# Patient Record
Sex: Female | Born: 1960 | Race: White | Hispanic: No | Marital: Single | Smoking: Current every day smoker
Health system: Southern US, Community
[De-identification: ages and names within clinical notes are randomized; demographics above are authoritative.]

## PROBLEM LIST (undated history)

## (undated) DIAGNOSIS — Z8739 Personal history of other diseases of the musculoskeletal system and connective tissue: Secondary | ICD-10-CM

## (undated) DIAGNOSIS — C801 Malignant (primary) neoplasm, unspecified: Secondary | ICD-10-CM

## (undated) DIAGNOSIS — J45909 Unspecified asthma, uncomplicated: Secondary | ICD-10-CM

## (undated) DIAGNOSIS — F32A Depression, unspecified: Secondary | ICD-10-CM

## (undated) DIAGNOSIS — F329 Major depressive disorder, single episode, unspecified: Secondary | ICD-10-CM

## (undated) DIAGNOSIS — Z720 Tobacco use: Secondary | ICD-10-CM

## (undated) DIAGNOSIS — E119 Type 2 diabetes mellitus without complications: Secondary | ICD-10-CM

## (undated) DIAGNOSIS — J439 Emphysema, unspecified: Secondary | ICD-10-CM

## (undated) DIAGNOSIS — F419 Anxiety disorder, unspecified: Secondary | ICD-10-CM

## (undated) HISTORY — DX: Tobacco use: Z72.0

## (undated) HISTORY — PX: CHOLECYSTECTOMY: SHX55

## (undated) HISTORY — PX: ABDOMINAL HYSTERECTOMY: SHX81

## (undated) SURGERY — Surgical Case
Anesthesia: *Unknown

---
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
## 2017-07-16 DIAGNOSIS — R7989 Other specified abnormal findings of blood chemistry: Secondary | ICD-10-CM | POA: Diagnosis not present

## 2017-07-16 DIAGNOSIS — Z8042 Family history of malignant neoplasm of prostate: Secondary | ICD-10-CM | POA: Diagnosis not present

## 2017-07-16 DIAGNOSIS — Z8052 Family history of malignant neoplasm of bladder: Secondary | ICD-10-CM | POA: Diagnosis not present

## 2017-07-16 DIAGNOSIS — D72829 Elevated white blood cell count, unspecified: Secondary | ICD-10-CM | POA: Diagnosis not present

## 2017-07-16 DIAGNOSIS — Z720 Tobacco use: Secondary | ICD-10-CM | POA: Diagnosis not present

## 2017-09-22 DIAGNOSIS — D72829 Elevated white blood cell count, unspecified: Secondary | ICD-10-CM | POA: Diagnosis not present

## 2017-09-22 DIAGNOSIS — D696 Thrombocytopenia, unspecified: Secondary | ICD-10-CM | POA: Diagnosis not present

## 2017-10-06 DIAGNOSIS — D509 Iron deficiency anemia, unspecified: Secondary | ICD-10-CM | POA: Diagnosis not present

## 2017-10-06 DIAGNOSIS — D72829 Elevated white blood cell count, unspecified: Secondary | ICD-10-CM | POA: Diagnosis not present

## 2017-10-06 DIAGNOSIS — D696 Thrombocytopenia, unspecified: Secondary | ICD-10-CM | POA: Diagnosis not present

## 2018-01-20 DIAGNOSIS — D473 Essential (hemorrhagic) thrombocythemia: Secondary | ICD-10-CM | POA: Diagnosis not present

## 2018-01-20 DIAGNOSIS — D509 Iron deficiency anemia, unspecified: Secondary | ICD-10-CM | POA: Diagnosis not present

## 2018-01-21 DIAGNOSIS — F17210 Nicotine dependence, cigarettes, uncomplicated: Secondary | ICD-10-CM | POA: Diagnosis not present

## 2018-01-21 DIAGNOSIS — R531 Weakness: Secondary | ICD-10-CM | POA: Diagnosis not present

## 2018-01-21 DIAGNOSIS — D72829 Elevated white blood cell count, unspecified: Secondary | ICD-10-CM | POA: Diagnosis not present

## 2018-01-21 DIAGNOSIS — D696 Thrombocytopenia, unspecified: Secondary | ICD-10-CM | POA: Diagnosis not present

## 2018-01-21 DIAGNOSIS — R634 Abnormal weight loss: Secondary | ICD-10-CM | POA: Diagnosis not present

## 2018-01-21 DIAGNOSIS — D509 Iron deficiency anemia, unspecified: Secondary | ICD-10-CM | POA: Diagnosis not present

## 2018-01-22 DIAGNOSIS — F411 Generalized anxiety disorder: Secondary | ICD-10-CM | POA: Diagnosis not present

## 2018-01-22 DIAGNOSIS — F331 Major depressive disorder, recurrent, moderate: Secondary | ICD-10-CM | POA: Diagnosis not present

## 2018-01-25 ENCOUNTER — Emergency Department (HOSPITAL_COMMUNITY): Payer: Medicare Other

## 2018-01-25 ENCOUNTER — Other Ambulatory Visit: Payer: Self-pay

## 2018-01-25 ENCOUNTER — Encounter (HOSPITAL_COMMUNITY): Payer: Self-pay

## 2018-01-25 ENCOUNTER — Inpatient Hospital Stay (HOSPITAL_COMMUNITY)
Admission: EM | Admit: 2018-01-25 | Discharge: 2018-01-28 | DRG: 885 | Disposition: A | Discharge status: Home / Self Care | Payer: Medicare Other | Attending: Internal Medicine | Admitting: Internal Medicine

## 2018-01-25 DIAGNOSIS — R4182 Altered mental status, unspecified: Secondary | ICD-10-CM

## 2018-01-25 DIAGNOSIS — R9431 Abnormal electrocardiogram [ECG] [EKG]: Secondary | ICD-10-CM | POA: Diagnosis present

## 2018-01-25 DIAGNOSIS — R410 Disorientation, unspecified: Secondary | ICD-10-CM | POA: Diagnosis not present

## 2018-01-25 DIAGNOSIS — F23 Brief psychotic disorder: Principal | ICD-10-CM | POA: Diagnosis present

## 2018-01-25 DIAGNOSIS — D72829 Elevated white blood cell count, unspecified: Secondary | ICD-10-CM | POA: Diagnosis present

## 2018-01-25 DIAGNOSIS — R338 Other retention of urine: Secondary | ICD-10-CM | POA: Diagnosis not present

## 2018-01-25 DIAGNOSIS — K219 Gastro-esophageal reflux disease without esophagitis: Secondary | ICD-10-CM | POA: Diagnosis not present

## 2018-01-25 DIAGNOSIS — Z90710 Acquired absence of both cervix and uterus: Secondary | ICD-10-CM

## 2018-01-25 DIAGNOSIS — Z9049 Acquired absence of other specified parts of digestive tract: Secondary | ICD-10-CM

## 2018-01-25 DIAGNOSIS — E876 Hypokalemia: Secondary | ICD-10-CM | POA: Diagnosis not present

## 2018-01-25 DIAGNOSIS — G929 Unspecified toxic encephalopathy: Secondary | ICD-10-CM | POA: Diagnosis present

## 2018-01-25 DIAGNOSIS — G92 Toxic encephalopathy: Secondary | ICD-10-CM | POA: Diagnosis not present

## 2018-01-25 DIAGNOSIS — E785 Hyperlipidemia, unspecified: Secondary | ICD-10-CM | POA: Diagnosis present

## 2018-01-25 DIAGNOSIS — Z720 Tobacco use: Secondary | ICD-10-CM | POA: Diagnosis not present

## 2018-01-25 DIAGNOSIS — E041 Nontoxic single thyroid nodule: Secondary | ICD-10-CM | POA: Diagnosis present

## 2018-01-25 DIAGNOSIS — F419 Anxiety disorder, unspecified: Secondary | ICD-10-CM | POA: Diagnosis present

## 2018-01-25 DIAGNOSIS — G40909 Epilepsy, unspecified, not intractable, without status epilepticus: Secondary | ICD-10-CM | POA: Diagnosis not present

## 2018-01-25 DIAGNOSIS — E860 Dehydration: Secondary | ICD-10-CM | POA: Diagnosis present

## 2018-01-25 DIAGNOSIS — F17210 Nicotine dependence, cigarettes, uncomplicated: Secondary | ICD-10-CM | POA: Diagnosis present

## 2018-01-25 DIAGNOSIS — G9340 Encephalopathy, unspecified: Secondary | ICD-10-CM

## 2018-01-25 DIAGNOSIS — Z781 Physical restraint status: Secondary | ICD-10-CM | POA: Diagnosis not present

## 2018-01-25 DIAGNOSIS — R0989 Other specified symptoms and signs involving the circulatory and respiratory systems: Secondary | ICD-10-CM | POA: Diagnosis not present

## 2018-01-25 DIAGNOSIS — G4700 Insomnia, unspecified: Secondary | ICD-10-CM | POA: Diagnosis present

## 2018-01-25 DIAGNOSIS — F29 Unspecified psychosis not due to a substance or known physiological condition: Secondary | ICD-10-CM | POA: Diagnosis present

## 2018-01-25 DIAGNOSIS — F329 Major depressive disorder, single episode, unspecified: Secondary | ICD-10-CM | POA: Diagnosis not present

## 2018-01-25 DIAGNOSIS — R339 Retention of urine, unspecified: Secondary | ICD-10-CM | POA: Diagnosis present

## 2018-01-25 DIAGNOSIS — Z79899 Other long term (current) drug therapy: Secondary | ICD-10-CM

## 2018-01-25 DIAGNOSIS — S0990XA Unspecified injury of head, initial encounter: Secondary | ICD-10-CM | POA: Diagnosis not present

## 2018-01-25 DIAGNOSIS — G8929 Other chronic pain: Secondary | ICD-10-CM | POA: Diagnosis present

## 2018-01-25 DIAGNOSIS — R748 Abnormal levels of other serum enzymes: Secondary | ICD-10-CM | POA: Diagnosis present

## 2018-01-25 DIAGNOSIS — S199XXA Unspecified injury of neck, initial encounter: Secondary | ICD-10-CM | POA: Diagnosis not present

## 2018-01-25 HISTORY — DX: Personal history of other diseases of the musculoskeletal system and connective tissue: Z87.39

## 2018-01-25 LAB — RAPID URINE DRUG SCREEN, HOSP PERFORMED
Amphetamines: NOT DETECTED
BARBITURATES: NOT DETECTED
BENZODIAZEPINES: NOT DETECTED
COCAINE: NOT DETECTED
OPIATES: NOT DETECTED
Tetrahydrocannabinol: POSITIVE — AB

## 2018-01-25 LAB — CBC WITH DIFFERENTIAL/PLATELET
BASOS ABS: 0 10*3/uL (ref 0.0–0.1)
Basophils Relative: 0 %
EOS ABS: 0.2 10*3/uL (ref 0.0–0.7)
Eosinophils Relative: 1 %
HCT: 50.8 % — ABNORMAL HIGH (ref 36.0–46.0)
Hemoglobin: 17.7 g/dL — ABNORMAL HIGH (ref 12.0–15.0)
LYMPHS ABS: 6.8 10*3/uL — AB (ref 0.7–4.0)
Lymphocytes Relative: 39 %
MCH: 31.3 pg (ref 26.0–34.0)
MCHC: 34.8 g/dL (ref 30.0–36.0)
MCV: 89.8 fL (ref 78.0–100.0)
MONO ABS: 1.2 10*3/uL — AB (ref 0.1–1.0)
Monocytes Relative: 7 %
NEUTROS ABS: 9.3 10*3/uL — AB (ref 1.7–7.7)
Neutrophils Relative %: 53 %
PLATELETS: 382 10*3/uL (ref 150–400)
RBC: 5.66 MIL/uL — AB (ref 3.87–5.11)
RDW: 14.9 % (ref 11.5–15.5)
WBC: 17.5 10*3/uL — AB (ref 4.0–10.5)

## 2018-01-25 LAB — COMPREHENSIVE METABOLIC PANEL
ALK PHOS: 145 U/L — AB (ref 38–126)
ALT: 34 U/L (ref 14–54)
AST: 27 U/L (ref 15–41)
Albumin: 4.3 g/dL (ref 3.5–5.0)
Anion gap: 14 (ref 5–15)
BILIRUBIN TOTAL: 0.5 mg/dL (ref 0.3–1.2)
BUN: 11 mg/dL (ref 6–20)
CALCIUM: 9.7 mg/dL (ref 8.9–10.3)
CO2: 22 mmol/L (ref 22–32)
CREATININE: 0.71 mg/dL (ref 0.44–1.00)
Chloride: 103 mmol/L (ref 101–111)
Glucose, Bld: 118 mg/dL — ABNORMAL HIGH (ref 65–99)
Potassium: 3 mmol/L — ABNORMAL LOW (ref 3.5–5.1)
Sodium: 139 mmol/L (ref 135–145)
TOTAL PROTEIN: 7.9 g/dL (ref 6.5–8.1)

## 2018-01-25 LAB — URINALYSIS, ROUTINE W REFLEX MICROSCOPIC
Bilirubin Urine: NEGATIVE
Glucose, UA: NEGATIVE mg/dL
Hgb urine dipstick: NEGATIVE
KETONES UR: NEGATIVE mg/dL
LEUKOCYTES UA: NEGATIVE
NITRITE: NEGATIVE
Protein, ur: NEGATIVE mg/dL
Specific Gravity, Urine: 1.006 (ref 1.005–1.030)
pH: 6 (ref 5.0–8.0)

## 2018-01-25 LAB — I-STAT TROPONIN, ED: TROPONIN I, POC: 0.01 ng/mL (ref 0.00–0.08)

## 2018-01-25 LAB — SALICYLATE LEVEL: Salicylate Lvl: <7 mg/dL (ref 2.8–30.0)

## 2018-01-25 LAB — ACETAMINOPHEN LEVEL: Acetaminophen (Tylenol), Serum: <10 ug/mL — ABNORMAL LOW (ref 10–30)

## 2018-01-25 LAB — I-STAT CG4 LACTIC ACID, ED: Lactic Acid, Venous: 0.77 mmol/L (ref 0.5–1.9)

## 2018-01-25 LAB — ETHANOL

## 2018-01-25 LAB — AMMONIA: Ammonia: 34 umol/L (ref 9–35)

## 2018-01-25 LAB — TSH: TSH: 2.811 u[IU]/mL (ref 0.350–4.500)

## 2018-01-25 MED ORDER — ONDANSETRON HCL 4 MG PO TABS: 4.0000 mg | ORAL_TABLET | Freq: Four times a day (QID) | ORAL | Status: DC | PRN

## 2018-01-25 MED ORDER — DICLOFENAC SODIUM 1 % TD GEL
2.0000 g | TRANSDERMAL | Status: DC | PRN
  Filled 2018-01-25: qty 100

## 2018-01-25 MED ORDER — SODIUM CHLORIDE 0.9% FLUSH
3.0000 mL | Freq: Two times a day (BID) | INTRAVENOUS | Status: DC
  Administered 2018-01-26: 3 mL via INTRAVENOUS

## 2018-01-25 MED ORDER — GABAPENTIN 400 MG PO CAPS
400.0000 mg | ORAL_CAPSULE | Freq: Every day | ORAL | Status: DC
  Administered 2018-01-26: 400 mg via ORAL
  Filled 2018-01-25: qty 1

## 2018-01-25 MED ORDER — ENOXAPARIN SODIUM 40 MG/0.4ML ~~LOC~~ SOLN
40.0000 mg | Freq: Every day | SUBCUTANEOUS | Status: DC
  Administered 2018-01-26 – 2018-01-27 (×3): 40 mg via SUBCUTANEOUS
  Filled 2018-01-25 (×3): qty 0.4

## 2018-01-25 MED ORDER — ONDANSETRON HCL 4 MG/2ML IJ SOLN: 4.0000 mg | Freq: Four times a day (QID) | INTRAMUSCULAR | Status: DC | PRN

## 2018-01-25 MED ORDER — SODIUM CHLORIDE 0.9 % IV SOLN
INTRAVENOUS | Status: DC
  Administered 2018-01-26 – 2018-01-28 (×4): via INTRAVENOUS

## 2018-01-25 MED ORDER — LORAZEPAM 2 MG/ML IJ SOLN
1.0000 mg | INTRAMUSCULAR | Status: DC | PRN
  Filled 2018-01-25: qty 1

## 2018-01-25 MED ORDER — POTASSIUM CHLORIDE CRYS ER 20 MEQ PO TBCR
40.0000 meq | EXTENDED_RELEASE_TABLET | ORAL | Status: AC
  Administered 2018-01-26: 40 meq via ORAL
  Filled 2018-01-25: qty 2

## 2018-01-25 MED ORDER — ALBUTEROL SULFATE HFA 108 (90 BASE) MCG/ACT IN AERS: 2.0000 | INHALATION_SPRAY | Freq: Four times a day (QID) | RESPIRATORY_TRACT | Status: DC | PRN

## 2018-01-25 MED ORDER — NICOTINE 21 MG/24HR TD PT24
21.0000 mg | MEDICATED_PATCH | Freq: Every day | TRANSDERMAL | Status: DC
  Administered 2018-01-26 – 2018-01-28 (×4): 21 mg via TRANSDERMAL
  Filled 2018-01-25 (×4): qty 1

## 2018-01-25 MED ORDER — IPRATROPIUM-ALBUTEROL 0.5-2.5 (3) MG/3ML IN SOLN: 3.0000 mL | Freq: Four times a day (QID) | RESPIRATORY_TRACT | Status: DC | PRN

## 2018-01-25 MED ORDER — FAMOTIDINE 20 MG PO TABS
20.0000 mg | ORAL_TABLET | Freq: Two times a day (BID) | ORAL | Status: DC
  Administered 2018-01-26 – 2018-01-28 (×5): 20 mg via ORAL
  Filled 2018-01-25 (×5): qty 1

## 2018-01-25 MED ORDER — ACETAMINOPHEN 650 MG RE SUPP: 650.0000 mg | Freq: Four times a day (QID) | RECTAL | Status: DC | PRN

## 2018-01-25 MED ORDER — ACETAMINOPHEN 325 MG PO TABS: 650.0000 mg | ORAL_TABLET | Freq: Four times a day (QID) | ORAL | Status: DC | PRN

## 2018-01-25 NOTE — H&P (Addendum)
History and Physical    Angela Kane UYQ:[id number] DOB: 06/29/1961 DOA: 01/25/2018  Referring MD/NP/PA: Dr. Ralene Bathe PCP: Patient, No Pcp Per  Patient coming from: home   Chief Complaint: Paranoid  I have personally briefly reviewed patient's old medical records in [place]   HPI: Angela Kane is a 57 y.o. female with medical history significant of HLD, tobacco abuse, anxiety, depression, goiter, seizure disorder; who presents with complaints of being altered and paranoid.  History is obtained from the patient and her daughter who is present at bedside.  Symptoms initially started 2 days ago with the patient was reporting that  "the law was coming to get her".  Patient states that the causes for personal reasons.  The patient's daughter questions if symptoms are related to medication changes as she was recently taken off of carbamazepine, zolpidem, trazodone, hydroxyzine, and Ativan in the last 1 week.  She was started on new medications of Cymbalta.  Symptoms progressively worsen to the point where patient thought that a trash can was a lady, confused her dog that she has had for many years, and thought her daughter was her mother who is deceased.  Associated symptoms include complaints of weight loss of 20 pounds over the last few months, poor p.o. intake, cough with productive white sputum, heartburn, urinary retention.  Denies any fevers, chills, focal weakness, chest pain, vomiting, diarrhea, or dysuria.  She had been staying with her daughter due to the symptoms and was noted to have fallen and been unable to get up as though she could not figure out how to use her legs earlier in the day.  She had been evaluated for her thyroid nodule last year and was noted to have a negative biopsy for malignancy at [facility].  ED Course: Upon admission into the emergency department patient was noted to be afebrile with vital signs relatively within normal limits.   Urine drug screen was positive for marijuana.  Labs revealed WBC 17.5, hemoglobin 17.7, platelets 382, sodium 139, potassium 3, alkaline phosphatase 145, lactic acid 0.77, and all other labs relatively within normal limits.CT scan of the brain and cervical spine revealed no acute abnormalities with a chronic appearing goiter.  TRH called to admit and recommending checking acetaminophen, salicylate, and TSH levels.  Review of Systems  Constitutional: Positive for weight loss. Negative for chills and fever.  HENT: Negative for congestion and nosebleeds.   Eyes: Negative for photophobia and pain.  Respiratory: Positive for cough and sputum production. Negative for wheezing.   Cardiovascular: Negative for chest pain and leg swelling.  Gastrointestinal: Positive for abdominal pain and heartburn. Negative for constipation and diarrhea.  Genitourinary: Negative for dysuria and hematuria.  Musculoskeletal: Positive for falls. Negative for myalgias.  Skin: Negative for itching and rash.  Neurological: Negative for seizures and loss of consciousness.  Psychiatric/Behavioral: Positive for hallucinations and substance abuse. Negative for suicidal ideas. The patient is nervous/anxious.     Past Medical History:  Diagnosis Date  . History of degenerative disc disease     Past Surgical History:  Procedure Laterality Date  . ABDOMINAL HYSTERECTOMY    . CHOLECYSTECTOMY       reports that she has been smoking.  She has been smoking about 2.00 packs per day. She does not have any smokeless tobacco history on file. She reports that she does not drink alcohol or use drugs.  Allergies  Allergen Reactions  . Amaranth (Fd&C Red #2) Hypertension  .  Metrizamide Hypertension    IV DYE  . Betadine [Povidone Iodine] Itching    "Makes the inside of my mouth itch, makes me itch internally, and it makes me want to scratch myself raw "  . Latex Itching  . Penicillins     Has patient had a PCN reaction causing  immediate rash, facial/tongue/throat swelling, SOB or lightheadedness with hypotension: Yes Has patient had a PCN reaction causing severe rash involving mucus membranes or skin necrosis: Unknown Has patient had a PCN reaction that required hospitalization: Unknown Has patient had a PCN reaction occurring within the last 10 years:No If all of the above answers are "NO", then may proceed with Cephalosporin use.  **Childhood alergy    Family History  Problem Relation Age of Onset  . Heart disease Mother   . Diabetes Mother   . Diabetes Father   . Heart disease Father     Prior to Admission medications   Medication Sig Start Date End Date Taking? Authorizing Provider  albuterol (PROVENTIL HFA;VENTOLIN HFA) 108 (90 Base) MCG/ACT inhaler Inhale 2 puffs into the lungs every 6 (six) hours as needed for wheezing or shortness of breath.   Yes [provider]  diclofenac sodium (VOLTAREN) 1 % GEL Apply 2 g topically as needed.    Yes [provider]  DULoxetine (CYMBALTA) 60 MG capsule Take 60 mg by mouth daily.   Yes [provider]  gabapentin (NEURONTIN) 800 MG tablet Take 400 mg by mouth daily.  11/13/16  Yes [provider]  meloxicam (MOBIC) 15 MG tablet Take 15 mg by mouth daily.   Yes [provider]  mirtazapine (REMERON SOL-TAB) 15 MG disintegrating tablet Take 15 mg by mouth at bedtime.   Yes [provider]  ranitidine (ZANTAC) 150 MG tablet Take 150 mg by mouth 2 (two) times daily.   Yes [provider]  Vitamin D, Ergocalciferol, (DRISDOL) 50000 units CAPS capsule Take 50,000 Units by mouth every 7 (seven) days.   Yes [provider]  LORazepam (ATIVAN) 0.5 MG tablet Take 0.5 mg by mouth every 8 (eight) hours.    [provider]    Physical Exam:  Constitutional: Patient is currently paranoid, but cooperative with exam Vitals:   01/25/18 1847 01/25/18 2017 01/25/18 2125  BP: (!) 154/95  (!) 146/71    Pulse: 92  89  Resp: 14  18  Temp: 98.7 F (37.1 C) 98.2 F (36.8 C)   TempSrc: Oral Rectal   SpO2: 94%  93%  Height: 5\' 2"  (1.575 m)     Eyes: PERRL, lids and conjunctivae normal ENMT: Mucous membranes are dry. Posterior pharynx clear of any exudate or lesions.  Neck: normal, no masses, no thyromegaly. No nuchal rigidity. Respiratory: clear to auscultation bilaterally, no wheezing, no crackles. Normal respiratory effort. No accessory muscle use.  Cardiovascular: Regular rate and rhythm, no murmurs / rubs / gallops. No extremity edema. 2+ pedal pulses. No carotid bruits.  Abdomen: no tenderness, no masses palpated. No hepatosplenomegaly. Bowel sounds positive.  Musculoskeletal: no clubbing / cyanosis. No joint deformity upper and lower extremities. Good ROM, no contractures. Normal muscle tone.  Skin: no rashes, lesions, ulcers. No induration Neurologic: CN 2-12 grossly intact. Sensation intact, DTR normal. Strength 5/5 in all 4.  Psychiatric: Abnormal judgment and insight. Alert and oriented x 3, but  Intermittently confused and still paranoid that the cops are coming after her.   Labs on Admission: I have personally reviewed following labs and imaging  studies  CBC: Recent Labs  Lab 01/25/18 1914  WBC 17.5*  NEUTROABS 9.3*  HGB 17.7*  HCT 50.8*  MCV 89.8  PLT 270   Basic Metabolic Panel: Recent Labs  Lab 01/25/18 1914  NA 139  K 3.0*  CL 103  CO2 22  GLUCOSE 118*  BUN 11  CREATININE 0.71  CALCIUM 9.7   GFR: CrCl cannot be calculated (Unknown ideal weight.). Liver Function Tests: Recent Labs  Lab 01/25/18 1914  AST 27  ALT 34  ALKPHOS 145*  BILITOT 0.5  PROT 7.9  ALBUMIN 4.3   No results for input(s): LIPASE, AMYLASE in the last 168 hours. Recent Labs  Lab 01/25/18 1915  AMMONIA 34   Coagulation Profile: No results for input(s): INR, PROTIME in the last 168 hours. Cardiac Enzymes: No results for input(s): CKTOTAL, CKMB, CKMBINDEX, TROPONINI in  the last 168 hours. BNP (last 3 results) No results for input(s): PROBNP in the last 8760 hours. HbA1C: No results for input(s): HGBA1C in the last 72 hours. CBG: No results for input(s): GLUCAP in the last 168 hours. Lipid Profile: No results for input(s): CHOL, HDL, LDLCALC, TRIG, CHOLHDL, LDLDIRECT in the last 72 hours. Thyroid Function Tests: No results for input(s): TSH, T4TOTAL, FREET4, T3FREE, THYROIDAB in the last 72 hours. Anemia Panel: No results for input(s): VITAMINB12, FOLATE, FERRITIN, TIBC, IRON, RETICCTPCT in the last 72 hours. Urine analysis:    Component Value Date/Time   COLORURINE YELLOW 01/25/2018 1915   APPEARANCEUR CLEAR 01/25/2018 1915   LABSPEC 1.006 01/25/2018 1915   PHURINE 6.0 01/25/2018 1915   GLUCOSEU NEGATIVE 01/25/2018 1915   HGBUR NEGATIVE 01/25/2018 1915   [place] NEGATIVE 01/25/2018 [place] 01/25/2018 1915   PROTEINUR NEGATIVE 01/25/2018 1915   NITRITE NEGATIVE 01/25/2018 1915   LEUKOCYTESUR NEGATIVE 01/25/2018 1915   Sepsis Labs: No results found for this or any previous visit (from the past 240 hour(s)).   Radiological Exams on Admission: Ct Head Wo Contrast  Result Date: 01/25/2018 CLINICAL DATA:  57 year old female with progressive unexplained altered mental status, confusion and paranoia for 2 days. Hallucinations today. Fall while being transported to the ED. EXAM: CT HEAD WITHOUT CONTRAST CT CERVICAL SPINE WITHOUT CONTRAST TECHNIQUE: Multidetector CT imaging of the head and cervical spine was performed following the standard protocol without intravenous contrast. Multiplanar CT image reconstructions of the cervical spine were also generated. COMPARISON:  Prior thyroid ultrasounds FINDINGS: CT HEAD FINDINGS Brain: No midline shift, ventriculomegaly, mass effect, evidence of mass lesion, intracranial hemorrhage or evidence of cortically based acute infarction. Gray-white matter differentiation is within normal limits  throughout the brain. No encephalomalacia identified. Vascular: Mild Calcified atherosclerosis at the skull base. No suspicious intracranial vascular hyperdensity. Skull: No acute osseous abnormality identified. Sinuses/Orbits: Clear. Other: Visualized orbit soft tissues are within normal limits. No acute scalp soft tissue finding. Mild left forehead scalp scarring. CT CERVICAL SPINE FINDINGS Alignment: Straightening of cervical lordosis. Bilateral posterior element alignment is within normal limits. Cervicothoracic junction alignment is within normal limits. Skull base and vertebrae: Osteopenia. Visualized skull base is intact. No atlanto-occipital dissociation. Congenital incomplete ossification of the posterior C1 ring. No cervical spine fracture identified. Soft tissues and spinal canal: No prevertebral fluid or swelling. No visible canal hematoma. Disc levels:  Normal for age. Upper chest: T1 spina bifida occulta (normal variant). Visible upper thoracic levels appear intact. Apical lung scarring greater on the right. Chronic right thyroid lower pole nodule. IMPRESSION: 1.  Normal noncontrast CT appearance of  the brain. 2. Negative for age CT appearance of the cervical spine. 3. Chronic thyroid goiter. Electronically Signed   By: Genevie Ann M.D.   On: 01/25/2018 20:19   Ct Cervical Spine Wo Contrast  Result Date: 01/25/2018 CLINICAL DATA:  57 year old female with progressive unexplained altered mental status, confusion and paranoia for 2 days. Hallucinations today. Fall while being transported to the ED. EXAM: CT HEAD WITHOUT CONTRAST CT CERVICAL SPINE WITHOUT CONTRAST TECHNIQUE: Multidetector CT imaging of the head and cervical spine was performed following the standard protocol without intravenous contrast. Multiplanar CT image reconstructions of the cervical spine were also generated. COMPARISON:  Prior thyroid ultrasounds FINDINGS: CT HEAD FINDINGS Brain: No midline shift, ventriculomegaly, mass effect,  evidence of mass lesion, intracranial hemorrhage or evidence of cortically based acute infarction. Gray-white matter differentiation is within normal limits throughout the brain. No encephalomalacia identified. Vascular: Mild Calcified atherosclerosis at the skull base. No suspicious intracranial vascular hyperdensity. Skull: No acute osseous abnormality identified. Sinuses/Orbits: Clear. Other: Visualized orbit soft tissues are within normal limits. No acute scalp soft tissue finding. Mild left forehead scalp scarring. CT CERVICAL SPINE FINDINGS Alignment: Straightening of cervical lordosis. Bilateral posterior element alignment is within normal limits. Cervicothoracic junction alignment is within normal limits. Skull base and vertebrae: Osteopenia. Visualized skull base is intact. No atlanto-occipital dissociation. Congenital incomplete ossification of the posterior C1 ring. No cervical spine fracture identified. Soft tissues and spinal canal: No prevertebral fluid or swelling. No visible canal hematoma. Disc levels:  Normal for age. Upper chest: T1 spina bifida occulta (normal variant). Visible upper thoracic levels appear intact. Apical lung scarring greater on the right. Chronic right thyroid lower pole nodule. IMPRESSION: 1.  Normal noncontrast CT appearance of the brain. 2. Negative for age CT appearance of the cervical spine. 3. Chronic thyroid goiter. Electronically Signed   By: Genevie Ann M.D.   On: 01/25/2018 20:19    EKG: Independently reviewed.  Normal sinus rhythm with QTC prolonged at 497  Assessment/Plan Acute encephalopathy: Acute.  Patient presents with confusion, visual hallucinations, and paranoia.  UDS positive for marijuana.  Question recent medication changes as patient was discontinued off of carbamazepine, zolpidem, trazodone, hydroxyzine, and Ativan.  She was started on Cymbalta.  Question of symptoms secondary to Cymbalta vs and/or marijuana/unknown drug use vs less likely stroke.   Patient does not  show signs of serotonin syndrome. - Admit to a telemetry bed - Neurochecks - Follow-up salicylate, acetaminophen, and thyroid levels - Hold Cymbalta and Remeron - May warrant psych evaluation and/or further imaging if symptoms persist  Leukocytosis: Question of chronic.  WBC elevated at 17.7 on admission with normal lactic acid. Urinalysis was negative for any acute abnormalities.  Family notes the patient's white blood cell counts have chronically been elevated, but review of records on care everywhere white blood cell count was not elevated in 2016. - Recheck CBC in a.m. - Check chest x-ray    Urinary retention.  Acute.  Patient reports inability to urinate.  This is 1 of the possible side effects of Cymbalta.   - Bladder scan with in and out cath as needed  Hypokalemia:acute.  Initial potassium noted to be 3 on admission. - Give 40 mEq of potassium chloride x1 dose now - Check magnesium level in a.m. - Continue to monitor and replace as needed  Prolong QT interval QTC noted to be initially 497 on admission. - Correcting electrolyte abnormalities - Recheck EKG - May want to discontinue QT prolonging medications,  if seen worsen  Hemoconcentration: Hemoglobin noted to be 17.7 on admission.  Family notes poor p.o. intake.  Question of possibility of dehydration vs. Polycythemia(history of tobacco use). - Recheck CBC in a.m.  Depression/anxiety   - Held Cymbalta consider an alternative medication  Chronic pain - Continue gabapentin and Voltaren gel  Elevated alkaline phosphatase: Acute.  Initially elevated up to 145 on admission previously noted to be within normal limits. - Recheck CMP in a.m.  History of seizure disorder: Family does not report any seizure activity in years. - Monitor for seizure-like activity  Thyroid nodule: Previously evaluated at [facility] last year and biopsy taken noted to be negative. - Follow-up TSH  Tobacco  abuse - Counseled on need of cessation of tobacco - Nicotine patch  GERD - Continue pharmacy substitution of Pepcid  DVT prophylaxis: lovenox   Code Status: full Family Communication: Discussed plan of care with the patient and family present at bedside Disposition Plan: Possibly discharge home in 1-2 days Consults called: none Admission status: observation  Norval Morton MD [facility] Pager [phone]   If 7PM-7AM, please contact night-coverage [email or web address] Password [facility]  01/25/2018, 9:32 PM

## 2018-01-25 NOTE — ED Provider Notes (Signed)
[facility] DEPT Provider Note   CSN: [id number] Arrival date & time: 01/25/18  1843     History   Chief Complaint Chief Complaint  Patient presents with  . Altered Mental Status  . Fall    HPI Angela Kane is a 57 y.o. female.  The history is provided by the patient. No language interpreter was used.  Altered Mental Status    Fall    Angela Kane is a 57 y.o. female who presents to the Emergency Department complaining of AMS.  Level V caveat due to AMS, she presents to the emergency department company by her daughter for change in mental status.  Daughter states that she was in her routine state of health until Saturday when the daughter noted that she started acting strangely with increased paranoia.  The patient has been calling her dog of 43 years kitty and has been thinking that her daughter's dog is her dog and confusing the chiuahuah for a bulldog.  The patient has been thinking that people are trying to kill her daily.  Patient denies any hallucinations or current complaints.  The daughter states that she has been having increased difficulty with walking today with frequent falls.  Unclear if there is been a head injury.  No reports of loss of consciousness.  Patient states that her medications were recently changed but is not sure with the changes were.  She currently lives at home alone and takes care of her activities of daily living.  The daughter states that she does have some slight memory lapses at times but has never had paranoia or hallucinations.  Daughter states the patient has a remote history of drug use and possibly smokes marijuana a few weeks ago with a neighbor but no other concerns for drug or alcohol use.  Patient denies any drug or alcohol use.  She denies any fevers, vomiting, headache, neck pain.  She has chronic back pain that is unchanged from baseline. Past Medical History:  Diagnosis Date  . History of  degenerative disc disease     Patient Active Problem List   Diagnosis Date Noted  . Acute encephalopathy 01/25/2018    Past Surgical History:  Procedure Laterality Date  . ABDOMINAL HYSTERECTOMY    . CHOLECYSTECTOMY      OB History    No data available       Home Medications    Prior to Admission medications   Medication Sig Start Date End Date Taking? Authorizing Provider  albuterol (PROVENTIL HFA;VENTOLIN HFA) 108 (90 Base) MCG/ACT inhaler Inhale 2 puffs into the lungs every 6 (six) hours as needed for wheezing or shortness of breath.   Yes [provider]  diclofenac sodium (VOLTAREN) 1 % GEL Apply 2 g topically as needed.    Yes [provider]  DULoxetine (CYMBALTA) 60 MG capsule Take 60 mg by mouth daily.   Yes [provider]  gabapentin (NEURONTIN) 800 MG tablet Take 400 mg by mouth daily.  11/13/16  Yes [provider]  meloxicam (MOBIC) 15 MG tablet Take 15 mg by mouth daily.   Yes [provider]  mirtazapine (REMERON SOL-TAB) 15 MG disintegrating tablet Take 15 mg by mouth at bedtime.   Yes [provider]  ranitidine (ZANTAC) 150 MG tablet Take 150 mg by mouth 2 (two) times daily.   Yes [provider]  Vitamin D, Ergocalciferol, (DRISDOL) 50000 units CAPS capsule Take 50,000 Units by mouth every 7 (seven) days.  Yes [provider]  LORazepam (ATIVAN) 0.5 MG tablet Take 0.5 mg by mouth every 8 (eight) hours.    [provider]    Family History History reviewed. No pertinent family history.  Social History Social History   Tobacco Use  . Smoking status: Current Every Day Smoker    Packs/day: 2.00  Substance Use Topics  . Alcohol use: No    Frequency: Never  . Drug use: No     Allergies   Amaranth (fd&c red #2); Metrizamide; Betadine [povidone iodine]; Latex; and Penicillins   Review of Systems Review of Systems  All other systems reviewed and are  negative.    Physical Exam Updated Vital Signs BP (!) 159/79   Pulse 77   Temp 98.2 F (36.8 C) (Rectal)   Resp 15   Ht 5\' 2"  (1.575 m)   SpO2 96%   Physical Exam  Constitutional: She is oriented to person, place, and time. She appears well-developed and well-nourished.  HENT:  Head: Normocephalic and atraumatic.  Cardiovascular: Normal rate and regular rhythm.  No murmur heard. Pulmonary/Chest: Effort normal and breath sounds normal. No respiratory distress.  Abdominal: Soft. There is no tenderness. There is no rebound and no guarding.  Musculoskeletal: She exhibits no edema or tenderness.  Neurological: She is alert and oriented to person, place, and time.  5 out of 5 strength in all 4 extremities.  Mildly confused.  Slow to answer questions.  Skin: Skin is warm and dry.  Psychiatric:  Flat affect.  Nursing note and vitals reviewed.    ED Treatments / Results  Labs (all labs ordered are listed, but only abnormal results are displayed) Labs Reviewed  RAPID URINE DRUG SCREEN, HOSP PERFORMED - Abnormal; Notable for the following components:      Result Value   Tetrahydrocannabinol POSITIVE (*)    All other components within normal limits  COMPREHENSIVE METABOLIC PANEL - Abnormal; Notable for the following components:   Potassium 3.0 (*)    Glucose, Bld 118 (*)    Alkaline Phosphatase 145 (*)    All other components within normal limits  CBC WITH DIFFERENTIAL/PLATELET - Abnormal; Notable for the following components:   WBC 17.5 (*)    RBC 5.66 (*)    Hemoglobin 17.7 (*)    HCT 50.8 (*)    Neutro Abs 9.3 (*)    Lymphs Abs 6.8 (*)    Monocytes Absolute 1.2 (*)    All other components within normal limits  ACETAMINOPHEN LEVEL - Abnormal; Notable for the following components:   Acetaminophen (Tylenol), Serum <10 (*)    All other components within normal limits  CULTURE, BLOOD (ROUTINE X 2)  CULTURE, BLOOD (ROUTINE X 2)  URINALYSIS, ROUTINE W REFLEX MICROSCOPIC   ETHANOL  AMMONIA  SALICYLATE LEVEL  TSH  PATHOLOGIST SMEAR REVIEW  HIV ANTIBODY (ROUTINE TESTING)  CBC  COMPREHENSIVE METABOLIC PANEL  MAGNESIUM  I-STAT TROPONIN, ED  I-STAT CG4 LACTIC ACID, ED    EKG  EKG Interpretation  Date/Time:  Monday January 25 2018 18:45:02 EST Ventricular Rate:  90 PR Interval:    QRS Duration: 85 QT Interval:  406 QTC Calculation: 497 R Axis:   100 Text Interpretation:  Sinus rhythm Right axis deviation Nonspecific T abnormalities, anterior leads Borderline prolonged QT interval No old tracing to compare Confirmed by Delora Fuel ([id number]) on 01/25/2018 11:40:33 PM       Radiology Ct Head Wo Contrast  Result Date: 01/25/2018 CLINICAL DATA:  57 year old female with  progressive unexplained altered mental status, confusion and paranoia for 2 days. Hallucinations today. Fall while being transported to the ED. EXAM: CT HEAD WITHOUT CONTRAST CT CERVICAL SPINE WITHOUT CONTRAST TECHNIQUE: Multidetector CT imaging of the head and cervical spine was performed following the standard protocol without intravenous contrast. Multiplanar CT image reconstructions of the cervical spine were also generated. COMPARISON:  Prior thyroid ultrasounds FINDINGS: CT HEAD FINDINGS Brain: No midline shift, ventriculomegaly, mass effect, evidence of mass lesion, intracranial hemorrhage or evidence of cortically based acute infarction. Gray-white matter differentiation is within normal limits throughout the brain. No encephalomalacia identified. Vascular: Mild Calcified atherosclerosis at the skull base. No suspicious intracranial vascular hyperdensity. Skull: No acute osseous abnormality identified. Sinuses/Orbits: Clear. Other: Visualized orbit soft tissues are within normal limits. No acute scalp soft tissue finding. Mild left forehead scalp scarring. CT CERVICAL SPINE FINDINGS Alignment: Straightening of cervical lordosis. Bilateral posterior element alignment is within normal limits.  Cervicothoracic junction alignment is within normal limits. Skull base and vertebrae: Osteopenia. Visualized skull base is intact. No atlanto-occipital dissociation. Congenital incomplete ossification of the posterior C1 ring. No cervical spine fracture identified. Soft tissues and spinal canal: No prevertebral fluid or swelling. No visible canal hematoma. Disc levels:  Normal for age. Upper chest: T1 spina bifida occulta (normal variant). Visible upper thoracic levels appear intact. Apical lung scarring greater on the right. Chronic right thyroid lower pole nodule. IMPRESSION: 1.  Normal noncontrast CT appearance of the brain. 2. Negative for age CT appearance of the cervical spine. 3. Chronic thyroid goiter. Electronically Signed   By: Genevie Ann M.D.   On: 01/25/2018 20:19   Ct Cervical Spine Wo Contrast  Result Date: 01/25/2018 CLINICAL DATA:  57 year old female with progressive unexplained altered mental status, confusion and paranoia for 2 days. Hallucinations today. Fall while being transported to the ED. EXAM: CT HEAD WITHOUT CONTRAST CT CERVICAL SPINE WITHOUT CONTRAST TECHNIQUE: Multidetector CT imaging of the head and cervical spine was performed following the standard protocol without intravenous contrast. Multiplanar CT image reconstructions of the cervical spine were also generated. COMPARISON:  Prior thyroid ultrasounds FINDINGS: CT HEAD FINDINGS Brain: No midline shift, ventriculomegaly, mass effect, evidence of mass lesion, intracranial hemorrhage or evidence of cortically based acute infarction. Gray-white matter differentiation is within normal limits throughout the brain. No encephalomalacia identified. Vascular: Mild Calcified atherosclerosis at the skull base. No suspicious intracranial vascular hyperdensity. Skull: No acute osseous abnormality identified. Sinuses/Orbits: Clear. Other: Visualized orbit soft tissues are within normal limits. No acute scalp soft tissue finding. Mild left forehead  scalp scarring. CT CERVICAL SPINE FINDINGS Alignment: Straightening of cervical lordosis. Bilateral posterior element alignment is within normal limits. Cervicothoracic junction alignment is within normal limits. Skull base and vertebrae: Osteopenia. Visualized skull base is intact. No atlanto-occipital dissociation. Congenital incomplete ossification of the posterior C1 ring. No cervical spine fracture identified. Soft tissues and spinal canal: No prevertebral fluid or swelling. No visible canal hematoma. Disc levels:  Normal for age. Upper chest: T1 spina bifida occulta (normal variant). Visible upper thoracic levels appear intact. Apical lung scarring greater on the right. Chronic right thyroid lower pole nodule. IMPRESSION: 1.  Normal noncontrast CT appearance of the brain. 2. Negative for age CT appearance of the cervical spine. 3. Chronic thyroid goiter. Electronically Signed   By: Genevie Ann M.D.   On: 01/25/2018 20:19   Dg Chest Port 1 View  Result Date: 01/25/2018 CLINICAL DATA:  Admission chest radiograph. Acute onset of encephalopathy. EXAM: PORTABLE CHEST 1  VIEW COMPARISON:  Chest radiograph performed 04/17/2017 FINDINGS: The lungs are well-aerated and clear. There is no evidence of focal opacification, pleural effusion or pneumothorax. The cardiomediastinal silhouette is within normal limits. No acute osseous abnormalities are seen. IMPRESSION: No acute cardiopulmonary process seen. Electronically Signed   By: Garald Balding M.D.   On: 01/25/2018 22:44    Procedures Procedures (including critical care time)  Medications Ordered in ED Medications  diclofenac sodium (VOLTAREN) 1 % transdermal gel 2 g (not administered)  famotidine (PEPCID) tablet 20 mg (20 mg Oral Given 01/26/18 0005)  gabapentin (NEURONTIN) capsule 400 mg (not administered)  enoxaparin (LOVENOX) injection 40 mg (not administered)  sodium chloride flush (NS) 0.9 % injection 3 mL (3 mLs Intravenous Not Given 01/26/18 0010)  0.9  %  sodium chloride infusion ( Intravenous New Bag/Given 01/26/18 0008)  ondansetron (ZOFRAN) tablet 4 mg (not administered)    Or  ondansetron (ZOFRAN) injection 4 mg (not administered)  acetaminophen (TYLENOL) tablet 650 mg (not administered)    Or  acetaminophen (TYLENOL) suppository 650 mg (not administered)  ipratropium-albuterol (DUONEB) 0.5-2.5 (3) MG/3ML nebulizer solution 3 mL (not administered)  nicotine (NICODERM CQ - dosed in mg/24 hours) patch 21 mg (21 mg Transdermal Patch Applied 01/26/18 0005)  LORazepam (ATIVAN) injection 1-2 mg (not administered)  potassium chloride SA (K-DUR,KLOR-CON) CR tablet 40 mEq (40 mEq Oral Given 01/26/18 0004)     Initial Impression / Assessment and Plan / ED Course  I have reviewed the triage vital signs and the nursing notes.  Pertinent labs & imaging results that were available during my care of the patient were reviewed by me and considered in my medical decision making (see chart for details).     Patient here for evaluation of change in mental status at this been waxing and waning over the last few days.  She was encephalopathic on ED arrival and on repeat assessment she had improved mentation.  Labs demonstrate leukocytosis, no other significant acute abnormalities.  Given acute delirium over the last several days plan to admit for further testing.  Hospitalist consulted for admission.  Final Clinical Impressions(s) / ED Diagnoses   Final diagnoses:  Acute encephalopathy    ED Discharge Orders    None       Quintella Reichert, MD 01/26/18 [phone]

## 2018-01-25 NOTE — ED Notes (Signed)
Bladder Scan reading = 300 ml.

## 2018-01-25 NOTE — ED Triage Notes (Signed)
Per pt's daughter, pt started showing signs of paranoia this past Saturday stating police wes "going to come ger her." Sunday daughter brought her to her house to stay with her for her safety. Today pt has become increasingly altered, not recognizing her daughter, her legs dont work, hallucinations.  EMS was called out today, but was not transported due to pt being paranoid. Pt experienced a fall coming into the ED, placed on backboard and transported to rm 25

## 2018-01-25 NOTE — ED Notes (Signed)
Bed: WA25 Expected date:  Expected time:  Means of arrival:  Comments: 

## 2018-01-26 ENCOUNTER — Encounter (HOSPITAL_COMMUNITY): Payer: Self-pay | Admitting: Internal Medicine

## 2018-01-26 ENCOUNTER — Other Ambulatory Visit: Payer: Self-pay

## 2018-01-26 DIAGNOSIS — E785 Hyperlipidemia, unspecified: Secondary | ICD-10-CM | POA: Diagnosis present

## 2018-01-26 DIAGNOSIS — Z79899 Other long term (current) drug therapy: Secondary | ICD-10-CM

## 2018-01-26 DIAGNOSIS — R4182 Altered mental status, unspecified: Secondary | ICD-10-CM

## 2018-01-26 DIAGNOSIS — F17210 Nicotine dependence, cigarettes, uncomplicated: Secondary | ICD-10-CM | POA: Diagnosis not present

## 2018-01-26 DIAGNOSIS — G9340 Encephalopathy, unspecified: Secondary | ICD-10-CM | POA: Diagnosis not present

## 2018-01-26 DIAGNOSIS — F329 Major depressive disorder, single episode, unspecified: Secondary | ICD-10-CM | POA: Diagnosis present

## 2018-01-26 DIAGNOSIS — E876 Hypokalemia: Secondary | ICD-10-CM | POA: Diagnosis present

## 2018-01-26 DIAGNOSIS — R9431 Abnormal electrocardiogram [ECG] [EKG]: Secondary | ICD-10-CM | POA: Diagnosis not present

## 2018-01-26 DIAGNOSIS — R338 Other retention of urine: Secondary | ICD-10-CM | POA: Diagnosis present

## 2018-01-26 DIAGNOSIS — G8929 Other chronic pain: Secondary | ICD-10-CM | POA: Diagnosis present

## 2018-01-26 DIAGNOSIS — E041 Nontoxic single thyroid nodule: Secondary | ICD-10-CM | POA: Diagnosis present

## 2018-01-26 DIAGNOSIS — F29 Unspecified psychosis not due to a substance or known physiological condition: Secondary | ICD-10-CM | POA: Diagnosis present

## 2018-01-26 DIAGNOSIS — K219 Gastro-esophageal reflux disease without esophagitis: Secondary | ICD-10-CM | POA: Diagnosis present

## 2018-01-26 DIAGNOSIS — D72829 Elevated white blood cell count, unspecified: Secondary | ICD-10-CM | POA: Diagnosis not present

## 2018-01-26 DIAGNOSIS — G92 Toxic encephalopathy: Secondary | ICD-10-CM | POA: Diagnosis not present

## 2018-01-26 DIAGNOSIS — F419 Anxiety disorder, unspecified: Secondary | ICD-10-CM | POA: Diagnosis present

## 2018-01-26 DIAGNOSIS — G4700 Insomnia, unspecified: Secondary | ICD-10-CM | POA: Diagnosis present

## 2018-01-26 DIAGNOSIS — Z9049 Acquired absence of other specified parts of digestive tract: Secondary | ICD-10-CM | POA: Diagnosis not present

## 2018-01-26 DIAGNOSIS — Z720 Tobacco use: Secondary | ICD-10-CM | POA: Diagnosis not present

## 2018-01-26 DIAGNOSIS — Z781 Physical restraint status: Secondary | ICD-10-CM | POA: Diagnosis not present

## 2018-01-26 DIAGNOSIS — R748 Abnormal levels of other serum enzymes: Secondary | ICD-10-CM | POA: Diagnosis present

## 2018-01-26 DIAGNOSIS — G40909 Epilepsy, unspecified, not intractable, without status epilepticus: Secondary | ICD-10-CM | POA: Diagnosis present

## 2018-01-26 DIAGNOSIS — R339 Retention of urine, unspecified: Secondary | ICD-10-CM | POA: Diagnosis present

## 2018-01-26 DIAGNOSIS — F23 Brief psychotic disorder: Secondary | ICD-10-CM | POA: Diagnosis present

## 2018-01-26 DIAGNOSIS — Z90710 Acquired absence of both cervix and uterus: Secondary | ICD-10-CM | POA: Diagnosis not present

## 2018-01-26 DIAGNOSIS — E860 Dehydration: Secondary | ICD-10-CM | POA: Diagnosis present

## 2018-01-26 LAB — CBC
HCT: 46.3 % — ABNORMAL HIGH (ref 36.0–46.0)
Hemoglobin: 15.5 g/dL — ABNORMAL HIGH (ref 12.0–15.0)
MCH: 30.5 pg (ref 26.0–34.0)
MCHC: 33.5 g/dL (ref 30.0–36.0)
MCV: 91 fL (ref 78.0–100.0)
PLATELETS: 345 10*3/uL (ref 150–400)
RBC: 5.09 MIL/uL (ref 3.87–5.11)
RDW: 14.9 % (ref 11.5–15.5)
WBC: 12.8 10*3/uL — AB (ref 4.0–10.5)

## 2018-01-26 LAB — COMPREHENSIVE METABOLIC PANEL
ALT: 26 U/L (ref 14–54)
AST: 21 U/L (ref 15–41)
Albumin: 3.3 g/dL — ABNORMAL LOW (ref 3.5–5.0)
Alkaline Phosphatase: 109 U/L (ref 38–126)
Anion gap: 10 (ref 5–15)
BUN: 8 mg/dL (ref 6–20)
CHLORIDE: 109 mmol/L (ref 101–111)
CO2: 22 mmol/L (ref 22–32)
CREATININE: 0.73 mg/dL (ref 0.44–1.00)
Calcium: 8.4 mg/dL — ABNORMAL LOW (ref 8.9–10.3)
GFR calc Af Amer: >60 mL/min (ref >60)
GFR calc non Af Amer: >60 mL/min (ref >60)
Glucose, Bld: 122 mg/dL — ABNORMAL HIGH (ref 65–99)
Potassium: 4.3 mmol/L (ref 3.5–5.1)
Sodium: 141 mmol/L (ref 135–145)
Total Bilirubin: 0.1 mg/dL — ABNORMAL LOW (ref 0.3–1.2)
Total Protein: 6.2 g/dL — ABNORMAL LOW (ref 6.5–8.1)

## 2018-01-26 LAB — MAGNESIUM: MAGNESIUM: 1.9 mg/dL (ref 1.7–2.4)

## 2018-01-26 LAB — GLUCOSE, CAPILLARY: GLUCOSE-CAPILLARY: 129 mg/dL — AB (ref 65–99)

## 2018-01-26 LAB — HIV ANTIBODY (ROUTINE TESTING W REFLEX): HIV SCREEN 4TH GENERATION: NONREACTIVE

## 2018-01-26 LAB — PATHOLOGIST SMEAR REVIEW

## 2018-01-26 MED ORDER — MELOXICAM 15 MG PO TABS
15.0000 mg | ORAL_TABLET | Freq: Every day | ORAL | Status: DC
  Administered 2018-01-26 – 2018-01-28 (×3): 15 mg via ORAL
  Filled 2018-01-26 (×3): qty 1

## 2018-01-26 MED ORDER — OLANZAPINE 10 MG IM SOLR
10.0000 mg | Freq: Once | INTRAMUSCULAR | Status: AC
  Administered 2018-01-26: 10 mg via INTRAMUSCULAR
  Filled 2018-01-26: qty 10

## 2018-01-26 MED ORDER — HALOPERIDOL LACTATE 5 MG/ML IJ SOLN
2.5000 mg | Freq: Four times a day (QID) | INTRAMUSCULAR | Status: DC | PRN
  Administered 2018-01-26 – 2018-01-27 (×2): 2.5 mg via INTRAVENOUS
  Filled 2018-01-26 (×3): qty 1

## 2018-01-26 MED ORDER — LORAZEPAM 2 MG/ML IJ SOLN: 1.0000 mg | INTRAMUSCULAR | Status: DC | PRN

## 2018-01-26 MED ORDER — MAGNESIUM SULFATE IN D5W 1-5 GM/100ML-% IV SOLN
1.0000 g | Freq: Once | INTRAVENOUS | Status: AC
  Administered 2018-01-26: 1 g via INTRAVENOUS
  Filled 2018-01-26: qty 100

## 2018-01-26 MED ORDER — DULOXETINE HCL 20 MG PO CPEP
40.0000 mg | ORAL_CAPSULE | Freq: Every day | ORAL | Status: DC
  Administered 2018-01-26 – 2018-01-28 (×3): 40 mg via ORAL
  Filled 2018-01-26 (×3): qty 2

## 2018-01-26 MED ORDER — GABAPENTIN 400 MG PO CAPS
800.0000 mg | ORAL_CAPSULE | Freq: Three times a day (TID) | ORAL | Status: DC
  Administered 2018-01-27 – 2018-01-28 (×4): 800 mg via ORAL
  Filled 2018-01-26 (×4): qty 2

## 2018-01-26 MED ORDER — HALOPERIDOL LACTATE 5 MG/ML IJ SOLN: 2.5000 mg | Freq: Once | INTRAMUSCULAR | Status: DC

## 2018-01-26 MED ORDER — LORAZEPAM 2 MG/ML IJ SOLN
1.0000 mg | Freq: Once | INTRAMUSCULAR | Status: AC
Start: 2018-01-26 — End: 2018-01-26
  Administered 2018-01-26: 1 mg via INTRAVENOUS
  Filled 2018-01-26: qty 1

## 2018-01-26 MED ORDER — STERILE WATER FOR INJECTION IJ SOLN
INTRAMUSCULAR | Status: AC
  Administered 2018-01-26: 10 mL
  Filled 2018-01-26: qty 10

## 2018-01-26 MED ORDER — LORAZEPAM 2 MG/ML IJ SOLN
1.0000 mg | INTRAMUSCULAR | Status: DC | PRN
  Administered 2018-01-26: 2 mg via INTRAVENOUS

## 2018-01-26 MED ORDER — OXCARBAZEPINE 150 MG PO TABS
150.0000 mg | ORAL_TABLET | Freq: Two times a day (BID) | ORAL | Status: DC
  Administered 2018-01-26 – 2018-01-28 (×4): 150 mg via ORAL
  Filled 2018-01-26 (×4): qty 1

## 2018-01-26 MED ORDER — HALOPERIDOL LACTATE 5 MG/ML IJ SOLN
0.5000 mg | Freq: Four times a day (QID) | INTRAMUSCULAR | Status: DC | PRN
  Filled 2018-01-26: qty 1

## 2018-01-26 MED ORDER — DEXTROSE 50 % IV SOLN
INTRAVENOUS | Status: AC
  Filled 2018-01-26: qty 50

## 2018-01-26 NOTE — Progress Notes (Signed)
PROGRESS NOTE  Angela Kane  UXN:[id number] DOB: May 17, 1961 DOA: 01/25/2018 PCP: Patient, No Pcp Per  Brief Narrative:   The patient is a 57 year old female with history of anxiety, depression, seizure disorder, hyperlipidemia and tobacco abuse who presented with paranoia and altered mental status.  She apparently had some changes to her medications about a week ago by her PCP secondary to difficulty sleeping and some increased anxiety.  Her medical history was reviewed by psychiatry who called the primary care doctor's office and pharmacy.  It is likely that the patient was confused about what medications she was supposed to be taking or not supposed to be taking.  She developed initially some paranoia that improved the following day but on the date of admission she became completely confused and started calling her dog cat, talking nonsensically.  She has not had any obvious signs of infection but she has not been eating and drinking well the last few days.  In the emergency department, she was found to have a leukocytosis and they were concerned that she may have an underlying infection.  Blood cultures were obtained.  Her urinalysis and chest x-ray were negative.  She was not started on any antibiotics but was admitted to medicine for observation overnight.  Her head CT demonstrated no acute abnormalities.  On the morning of 2/12 she became extremely agitated and did not respond to IM Ativan.  She was put in restraints temporarily and given Zyprexa IM which allowed her to calm down and relax today.  She was seen by psychiatry who recommended resuming the medications that she was taking prior to the changes a week ago.  I have restarted these medications.  Assessment & Plan:  Acute toxic encephalopathy likely due to rapid and numerous changes to her psychiatric medications for anxiety and depression.  NO signs of stroke or seizure on exam.  No obvious signs of infection either and her WBC has  trended down with IVF. - Salicylate and acetaminophen negative - TSH wnl -  zyprexa 63m IM once this morning -  Psychiatry consulted, appreciate assistance -  Resume medications patient had been on a week ago:  cymbalta 443mdaily, gabapentin 80062mID, trileptal 150m32mD -  Haldol 0.5mg 75m prn agitation  Leukocytosis, likely due to dehydration and trending down with IVF -  BCx NGTD -  UA negative -  CXR negative   Acute urinary retention, may be due to medication side effect.  Resolving -  In and out cath  Hypokalemia, acute, resolved with oral potassium repletion -  Magnesium 1.9gm/dl - Give 40 mEq of potassium chloride x1 dose now - Check magnesium level in a.m. - Continue to monitor and replace as needed  Mildly prolonged QTC: 497 msec on admission. - Correcting electrolyte abnormalities - Recheck EKG in AM  Hemoconcentration: Hemoglobin, WBC, and alk phos all elevated and improving with IVF -  Continue IVF  Chronic pain - Continue Voltaren gel  History of seizure disorder: Family does not report any seizure activity in years. - trileptal as above  Thyroid nodule: Previously evaluated at [facility] year and biopsy taken noted to be negative. - TSH 2.811  Tobacco abuse - Counseled on need of cessation of tobacco - Nicotine patch  GERD - Continue pharmacy substitution of Pepcid   DVT prophylaxis: Lovenox Code Status: Full code Family Communication: Patient and her daughter who was at bedside Disposition Plan: Home once her mentation has improved, infection ruled out.  Repeat CBC in a.m.   Consultants:   Psychiatry  Procedures:  None  Antimicrobials:  Anti-infectives (From admission, onward)   None       Subjective:  Patient is still under the effects of the Zyprexa she received earlier.  Limited history obtained.  Denies pain, shortness of breath, nausea.  Objective: Vitals:   01/26/18 1200 01/26/18 1300  01/26/18 1526 01/26/18 1623  BP: 122/73 126/71 115/67 110/82  Pulse: (!) 104 (!) 103 80 84  Resp: (!) 21 (!) 26 (!) 26 20  Temp:    98.2 F (36.8 C)  TempSrc:    Oral  SpO2: 91% 94% 94% 98%  Height:    '5\' 2"'$  (1.575 m)   No intake or output data in the 24 hours ending 01/26/18 1729 There were no vitals filed for this visit.  Examination:  General exam:  Adult female, intermittently falling asleep during interview.  No acute distress.  HEENT:  NCAT, MMM Respiratory system: Clear to auscultation bilaterally Cardiovascular system: Regular rate and rhythm, normal S1/S2. No murmurs, rubs, gallops or clicks.  Warm extremities Gastrointestinal system: Normal active bowel sounds, soft, nondistended, nontender. MSK:  Normal tone and bulk, no lower extremity edema Neuro:  Grossly intact    Data Reviewed: I have personally reviewed following labs and imaging studies  CBC: Recent Labs  Lab 01/25/18 1914 01/26/18 0525  WBC 17.5* 12.8*  NEUTROABS 9.3*  --   HGB 17.7* 15.5*  HCT 50.8* 46.3*  MCV 89.8 91.0  PLT 382 412   Basic Metabolic Panel: Recent Labs  Lab 01/25/18 1914 01/26/18 0525  NA 139 141  K 3.0* 4.3  CL 103 109  CO2 22 22  GLUCOSE 118* 122*  BUN 11 8  CREATININE 0.71 0.73  CALCIUM 9.7 8.4*  MG  --  1.9   GFR: CrCl cannot be calculated (Unknown ideal weight.). Liver Function Tests: Recent Labs  Lab 01/25/18 1914 01/26/18 0525  AST 27 21  ALT 34 26  ALKPHOS 145* 109  BILITOT 0.5 0.1*  PROT 7.9 6.2*  ALBUMIN 4.3 3.3*   No results for input(s): LIPASE, AMYLASE in the last 168 hours. Recent Labs  Lab 01/25/18 1915  AMMONIA 34   Coagulation Profile: No results for input(s): INR, PROTIME in the last 168 hours. Cardiac Enzymes: No results for input(s): CKTOTAL, CKMB, CKMBINDEX, TROPONINI in the last 168 hours. BNP (last 3 results) No results for input(s): PROBNP in the last 8760 hours. HbA1C: No results for input(s): HGBA1C in the last 72  hours. CBG: No results for input(s): GLUCAP in the last 168 hours. Lipid Profile: No results for input(s): CHOL, HDL, LDLCALC, TRIG, CHOLHDL, LDLDIRECT in the last 72 hours. Thyroid Function Tests: Recent Labs    01/25/18 1924  TSH 2.811   Anemia Panel: No results for input(s): VITAMINB12, FOLATE, FERRITIN, TIBC, IRON, RETICCTPCT in the last 72 hours. Urine analysis:    Component Value Date/Time   COLORURINE YELLOW 01/25/2018 1915   APPEARANCEUR CLEAR 01/25/2018 1915   LABSPEC 1.006 01/25/2018 1915   PHURINE 6.0 01/25/2018 1915   GLUCOSEU NEGATIVE 01/25/2018 1915   HGBUR NEGATIVE 01/25/2018 1915   [place] NEGATIVE 01/25/2018 1915   KETONESUR NEGATIVE 01/25/2018 1915   PROTEINUR NEGATIVE 01/25/2018 1915   NITRITE NEGATIVE 01/25/2018 1915   LEUKOCYTESUR NEGATIVE 01/25/2018 1915   Sepsis Labs: '@LABRCNTIP'$ (procalcitonin:4,lacticidven:4)  )No results found for this or any previous visit (from the past 240 hour(s)).    Radiology Studies: Ct Head Wo Contrast  Result Date: 01/25/2018 CLINICAL DATA:  57 year old female with progressive unexplained altered mental status, confusion and paranoia for 2 days. Hallucinations today. Fall while being transported to the ED. EXAM: CT HEAD WITHOUT CONTRAST CT CERVICAL SPINE WITHOUT CONTRAST TECHNIQUE: Multidetector CT imaging of the head and cervical spine was performed following the standard protocol without intravenous contrast. Multiplanar CT image reconstructions of the cervical spine were also generated. COMPARISON:  Prior thyroid ultrasounds FINDINGS: CT HEAD FINDINGS Brain: No midline shift, ventriculomegaly, mass effect, evidence of mass lesion, intracranial hemorrhage or evidence of cortically based acute infarction. Gray-white matter differentiation is within normal limits throughout the brain. No encephalomalacia identified. Vascular: Mild Calcified atherosclerosis at the skull base. No suspicious intracranial vascular hyperdensity.  Skull: No acute osseous abnormality identified. Sinuses/Orbits: Clear. Other: Visualized orbit soft tissues are within normal limits. No acute scalp soft tissue finding. Mild left forehead scalp scarring. CT CERVICAL SPINE FINDINGS Alignment: Straightening of cervical lordosis. Bilateral posterior element alignment is within normal limits. Cervicothoracic junction alignment is within normal limits. Skull base and vertebrae: Osteopenia. Visualized skull base is intact. No atlanto-occipital dissociation. Congenital incomplete ossification of the posterior C1 ring. No cervical spine fracture identified. Soft tissues and spinal canal: No prevertebral fluid or swelling. No visible canal hematoma. Disc levels:  Normal for age. Upper chest: T1 spina bifida occulta (normal variant). Visible upper thoracic levels appear intact. Apical lung scarring greater on the right. Chronic right thyroid lower pole nodule. IMPRESSION: 1.  Normal noncontrast CT appearance of the brain. 2. Negative for age CT appearance of the cervical spine. 3. Chronic thyroid goiter. Electronically Signed   By: Genevie Ann M.D.   On: 01/25/2018 20:19   Ct Cervical Spine Wo Contrast  Result Date: 01/25/2018 CLINICAL DATA:  57 year old female with progressive unexplained altered mental status, confusion and paranoia for 2 days. Hallucinations today. Fall while being transported to the ED. EXAM: CT HEAD WITHOUT CONTRAST CT CERVICAL SPINE WITHOUT CONTRAST TECHNIQUE: Multidetector CT imaging of the head and cervical spine was performed following the standard protocol without intravenous contrast. Multiplanar CT image reconstructions of the cervical spine were also generated. COMPARISON:  Prior thyroid ultrasounds FINDINGS: CT HEAD FINDINGS Brain: No midline shift, ventriculomegaly, mass effect, evidence of mass lesion, intracranial hemorrhage or evidence of cortically based acute infarction. Gray-white matter differentiation is within normal limits throughout  the brain. No encephalomalacia identified. Vascular: Mild Calcified atherosclerosis at the skull base. No suspicious intracranial vascular hyperdensity. Skull: No acute osseous abnormality identified. Sinuses/Orbits: Clear. Other: Visualized orbit soft tissues are within normal limits. No acute scalp soft tissue finding. Mild left forehead scalp scarring. CT CERVICAL SPINE FINDINGS Alignment: Straightening of cervical lordosis. Bilateral posterior element alignment is within normal limits. Cervicothoracic junction alignment is within normal limits. Skull base and vertebrae: Osteopenia. Visualized skull base is intact. No atlanto-occipital dissociation. Congenital incomplete ossification of the posterior C1 ring. No cervical spine fracture identified. Soft tissues and spinal canal: No prevertebral fluid or swelling. No visible canal hematoma. Disc levels:  Normal for age. Upper chest: T1 spina bifida occulta (normal variant). Visible upper thoracic levels appear intact. Apical lung scarring greater on the right. Chronic right thyroid lower pole nodule. IMPRESSION: 1.  Normal noncontrast CT appearance of the brain. 2. Negative for age CT appearance of the cervical spine. 3. Chronic thyroid goiter. Electronically Signed   By: Genevie Ann M.D.   On: 01/25/2018 20:19   Dg Chest Port 1 View  Result Date: 01/25/2018 CLINICAL DATA:  Admission chest  radiograph. Acute onset of encephalopathy. EXAM: PORTABLE CHEST 1 VIEW COMPARISON:  Chest radiograph performed 04/17/2017 FINDINGS: The lungs are well-aerated and clear. There is no evidence of focal opacification, pleural effusion or pneumothorax. The cardiomediastinal silhouette is within normal limits. No acute osseous abnormalities are seen. IMPRESSION: No acute cardiopulmonary process seen. Electronically Signed   By: Garald Balding M.D.   On: 01/25/2018 22:44     Scheduled Meds: . dextrose      . DULoxetine  40 mg Oral Daily  . enoxaparin (LOVENOX) injection  40 mg  Subcutaneous QHS  . famotidine  20 mg Oral BID  . [START ON 01/27/2018] gabapentin  800 mg Oral TID  . meloxicam  15 mg Oral Daily  . nicotine  21 mg Transdermal Daily  . OXcarbazepine  150 mg Oral BID  . sodium chloride flush  3 mL Intravenous Q12H   Continuous Infusions: . sodium chloride 75 mL/hr at 01/26/18 1607     LOS: 0 days    Time spent: 30 min    Janece Canterbury, MD [facility] Pager [phone]  If 7PM-7AM, please contact night-coverage [email or web address] Password [facility] 01/26/2018, 5:29 PM

## 2018-01-26 NOTE — ED Notes (Signed)
2 mg ativan given left leg and pt still combative. Pt states she cannot breathe. Attempting to place pulse oximetry at this time despite combative behavior.

## 2018-01-26 NOTE — Progress Notes (Addendum)
Pt up to the bathroom, begin to have periods of acute forgetfulness, flaccid and flailing body with non movement at times, disoriented to name and and place. Pt placed in wheelchair and returned to bed. Pt flaccid and incomprehensible baffling conversation, pt begin to have intermittent tremors, then stop, with periods where she would flail her body  in bed and then flaccid without movement, then intermittent focal tremors lasting about 30 minutes, ON call NP paged, Rapid Response called; Haldol and Ativan given as ordered, and pt settled down and sleeping intermittent then flail arms until she relaxed and begin sleeping again, Family at bedside and witnessed event, stating this is appropriate to what occurred to bring her in the hospital. Pt disoriented to name and location. Mumbled pet names she does not have, according to family. VS stable and O2 sat on 2l 98 %. Will cont be monitor by staff. SRP,RN

## 2018-01-26 NOTE — Consult Note (Signed)
[facility] Consult   Reason for Consult:  Hallucinations/agitation  Referring Physician:  Dr. Sheran Fava Patient Identification: Angela Kane MRN:  [id number] Principal Diagnosis: Altered mental status Diagnosis:   Patient Active Problem List   Diagnosis Date Noted  . Acute urinary retention [R33.8] 01/26/2018  . Leukocytosis [D72.829] 01/26/2018  . Prolonged Q-T interval on ECG [R94.31] 01/26/2018  . Tobacco abuse [Z72.0] 01/26/2018  . GERD (gastroesophageal reflux disease) [K21.9] 01/26/2018  . Acute encephalopathy [G93.40] 01/25/2018    Total Time spent with patient: 1 hour  Subjective:   Angela Kane is a 57 y.o. female patient admitted with altered mental status.  HPI:   Per chart review, patient has a history of anxiety and depression and was admitted for altered mental status with paranoia. Her symptoms started 2 days ago. She reported that "the law was coming to get her." She also had visual hallucinations and confusion. She thought her daughter was her mother who is deceased. Her daughter questions if her recent medication changes have contributed to this change. She was discontinued from Tegretol, Ambien, Trazodone, Atarax and Ativan last week. She was started on Cymbalta. She has lost 20 pounds over the past few months due to poor PO intake. She has also had cough with productive white sputum and urinary retention. She has had difficulty with ambulation and frequent falls. She has been staying with her daughter due to her recent medical condition. She does not have a history of psychosis. UDS was positive for THC. She received IV Ativan 2 mg for agitation and required restraints.   On interview, Angela Kane endorses visual hallucinations that the cops were in her hospital room earlier today. She is unable to recall the prior events that led to her hospitalization and does not remember feeling paranoid. Her daughters were present at bedside with her  permission. Her daughter reports that Saturday she called her to inform her that the cops were watching her and would raid her house. She also believed that she was going to prison. She was upset that her family did not believe her. She also became increasingly confused and disoriented. She has a dog but was calling it a cat. On Sunday, she forgot how to use the stove. She has not been sleeping well. She was unable to provide a timeline of the adjustments made to her medications although both the patient and her daughters believe that Ambien 10 mg daily, Ativan 0.5 mg qhs, Trazodone 100 mg qhs, Trileptal 150 mg BID and Atarax 50 mg qhs were discontinued a week ago and Cymbalta 60 mg daily was started. She reports worsening mood and anxiety with these changes. She also reports poor appetite secondary to poor PO intake for the past 2 months. She reports that she is currently130 pounds and was previously 143 pounds. She believes that Cymbalta caused paranoia so she only took 2 doses of this medication. She denies current SI, HI or AVH. Her daughters report that she has had some short term memory deficits for longer than a week prior to her recent medication changes.   The patient's PCP, Dr. Lynann Beaver was contacted. His nurse was able to provide information about her last office visit. She has been a patient with their clinic since September. She was previously seeing another PCP. Her doctor continued Gabapentin 800 mg TID, Cymbalta 40 mg daily and Trileptal 150 mg BID. The patient endorsed worsening anxiety and insomnia at her recent visit on 1/8 in the setting of  an upcoming medical procedure. Cymbalta was increased to 60 mg daily and Remeron 15 mg qhs was started for insomnia at this visit. She is diagnosed with GAD and recurrent MDD.   Past Psychiatric History: MDD and GAD.   Risk to Self: Is patient at risk for suicide?: No Risk to Others:  None. Denies HI.  Prior Inpatient Therapy:  Denies Prior  Outpatient Therapy:  Her medications are managed by her PCP, Dr. Lynann Beaver.   Past Medical History:  Past Medical History:  Diagnosis Date  . History of degenerative disc disease     Past Surgical History:  Procedure Laterality Date  . ABDOMINAL HYSTERECTOMY    . CHOLECYSTECTOMY     Family History:  Family History  Problem Relation Age of Onset  . Heart disease Mother   . Diabetes Mother   . Diabetes Father   . Heart disease Father    Family Psychiatric  History: Unknown to patient.  Social History:  Social History   Substance and Sexual Activity  Alcohol Use No  . Frequency: Never     Social History   Substance and Sexual Activity  Drug Use No    Social History   Socioeconomic History  . Marital status: Single    Spouse name: None  . Number of children: None  . Years of education: None  . Highest education level: None  Social Needs  . Financial resource strain: None  . Food insecurity - worry: None  . Food insecurity - inability: None  . Transportation needs - medical: None  . Transportation needs - non-medical: None  Occupational History  . None  Tobacco Use  . Smoking status: Current Every Day Smoker    Packs/day: 2.00  Substance and Sexual Activity  . Alcohol use: No    Frequency: Never  . Drug use: No  . Sexual activity: None  Other Topics Concern  . None  Social History Narrative  . None   Additional Social History: She lives at home alone. She is disabled. She reports marijuana use. She last used on Thursday. She denies alcohol use.     Allergies:   Allergies  Allergen Reactions  . Amaranth (Fd&C Red #2) Hypertension  . Metrizamide Hypertension    IV DYE  . Ativan [Lorazepam]   . Betadine [Povidone Iodine] Itching    "Makes the inside of my mouth itch, makes me itch internally, and it makes me want to scratch myself raw "  . Latex Itching  . Penicillins     Has patient had a PCN reaction causing immediate rash,  facial/tongue/throat swelling, SOB or lightheadedness with hypotension: Yes Has patient had a PCN reaction causing severe rash involving mucus membranes or skin necrosis: Unknown Has patient had a PCN reaction that required hospitalization: Unknown Has patient had a PCN reaction occurring within the last 10 years:No If all of the above answers are "NO", then may proceed with Cephalosporin use.  **Childhood alergy    Labs:  Results for orders placed or performed during the hospital encounter of 01/25/18 (from the past 48 hour(s))  Comprehensive metabolic panel     Status: Abnormal   Collection Time: 01/25/18  7:14 PM  Result Value Ref Range   Sodium 139 135 - 145 mmol/L   Potassium 3.0 (L) 3.5 - 5.1 mmol/L   Chloride 103 101 - 111 mmol/L   CO2 22 22 - 32 mmol/L   Glucose, Bld 118 (H) 65 - 99 mg/dL   BUN 11  6 - 20 mg/dL   Creatinine, Ser 0.71 0.44 - 1.00 mg/dL   Calcium 9.7 8.9 - 10.3 mg/dL   Total Protein 7.9 6.5 - 8.1 g/dL   Albumin 4.3 3.5 - 5.0 g/dL   AST 27 15 - 41 U/L   ALT 34 14 - 54 U/L   Alkaline Phosphatase 145 (H) 38 - 126 U/L   Total Bilirubin 0.5 0.3 - 1.2 mg/dL   GFR calc non Af Amer >60 >60 mL/min   GFR calc Af Amer >60 >60 mL/min    Comment: (NOTE) The eGFR has been calculated using the CKD EPI equation. This calculation has not been validated in all clinical situations. eGFR's persistently <60 mL/min signify possible Chronic Kidney Disease.    Anion gap 14 5 - 15    Comment: Performed at [facility], [place] [address] [place], [place] [address]  Ethanol     Status: None   Collection Time: 01/25/18  7:14 PM  Result Value Ref Range   Alcohol, Ethyl (B) <10 <10 mg/dL    Comment:        LOWEST DETECTABLE LIMIT FOR SERUM ALCOHOL IS 10 mg/dL FOR MEDICAL PURPOSES ONLY Performed at [facility] [place] [facility] [address], [place], [place] [address]   CBC with Differential     Status: Abnormal   Collection Time: 01/25/18  7:14 PM   Result Value Ref Range   WBC 17.5 (H) 4.0 - 10.5 K/uL   RBC 5.66 (H) 3.87 - 5.11 MIL/uL   Hemoglobin 17.7 (H) 12.0 - 15.0 g/dL   HCT 50.8 (H) 36.0 - 46.0 %   MCV 89.8 78.0 - 100.0 fL   MCH 31.3 26.0 - 34.0 pg   MCHC 34.8 30.0 - 36.0 g/dL   RDW 14.9 11.5 - 15.5 %   Platelets 382 150 - 400 K/uL   Neutrophils Relative % 53 %   Lymphocytes Relative 39 %   Monocytes Relative 7 %   Eosinophils Relative 1 %   Basophils Relative 0 %   Neutro Abs 9.3 (H) 1.7 - 7.7 K/uL   Lymphs Abs 6.8 (H) 0.7 - 4.0 K/uL   Monocytes Absolute 1.2 (H) 0.1 - 1.0 K/uL   Eosinophils Absolute 0.2 0.0 - 0.7 K/uL   Basophils Absolute 0.0 0.0 - 0.1 K/uL   WBC Morphology ABSOLUTE LYMPHOCYTOSIS     Comment: Performed at [facility], [place] [address], [place], [place] [address]  Urinalysis, Routine w reflex microscopic     Status: None   Collection Time: 01/25/18  7:15 PM  Result Value Ref Range   Color, Urine YELLOW YELLOW   APPearance CLEAR CLEAR   Specific Gravity, Urine 1.006 1.005 - 1.030   pH 6.0 5.0 - 8.0   Glucose, UA NEGATIVE NEGATIVE mg/dL   Hgb urine dipstick NEGATIVE NEGATIVE   Bilirubin Urine NEGATIVE NEGATIVE   Ketones, ur NEGATIVE NEGATIVE mg/dL   Protein, ur NEGATIVE NEGATIVE mg/dL   Nitrite NEGATIVE NEGATIVE   Leukocytes, UA NEGATIVE NEGATIVE    Comment: Performed at [facility] [address], [place], [place] [address]  Ammonia     Status: None   Collection Time: 01/25/18  7:15 PM  Result Value Ref Range   Ammonia 34 9 - 35 umol/L    Comment: Performed at [facility], [place] [address] [place],  [address]  TSH     Status: None   Collection Time: 01/25/18  7:24 PM  Result  Value Ref Range   TSH 2.811 0.350 - 4.500 uIU/mL    Comment: Performed by a 3rd Generation assay with a functional sensitivity of <=0.01 uIU/mL. Performed at [facility], [place] [address], [place], [place] [address]   I-stat troponin, ED      Status: None   Collection Time: 01/25/18  7:27 PM  Result Value Ref Range   Troponin i, poc 0.01 0.00 - 0.08 ng/mL   Comment 3            Comment: Due to the release kinetics of cTnI, a negative result within the first hours of the onset of symptoms does not rule out myocardial infarction with certainty. If myocardial infarction is still suspected, repeat the test at appropriate intervals.   Urine rapid drug screen (hosp performed)     Status: Abnormal   Collection Time: 01/25/18  7:31 PM  Result Value Ref Range   Opiates NONE DETECTED NONE DETECTED   Cocaine NONE DETECTED NONE DETECTED   Benzodiazepines NONE DETECTED NONE DETECTED   Amphetamines NONE DETECTED NONE DETECTED   Tetrahydrocannabinol POSITIVE (A) NONE DETECTED   Barbiturates NONE DETECTED NONE DETECTED    Comment: (NOTE) DRUG SCREEN FOR MEDICAL PURPOSES ONLY.  IF CONFIRMATION IS NEEDED FOR ANY PURPOSE, NOTIFY LAB WITHIN 5 DAYS. LOWEST DETECTABLE LIMITS FOR URINE DRUG SCREEN Drug Class                     Cutoff (ng/mL) Amphetamine and metabolites    1000 Barbiturate and metabolites    200 Benzodiazepine                 295 Tricyclics and metabolites     300 Opiates and metabolites        300 Cocaine and metabolites        300 THC                            50 Performed at [facility], [place] [address], [place], [place] [address]   Acetaminophen level     Status: Abnormal   Collection Time: 01/25/18  9:36 PM  Result Value Ref Range   Acetaminophen (Tylenol), Serum <10 (L) 10 - 30 ug/mL    Comment:        THERAPEUTIC CONCENTRATIONS VARY SIGNIFICANTLY. A RANGE OF 10-30 ug/mL MAY BE AN EFFECTIVE CONCENTRATION FOR MANY PATIENTS. HOWEVER, SOME ARE BEST TREATED AT CONCENTRATIONS OUTSIDE THIS RANGE. ACETAMINOPHEN CONCENTRATIONS >150 ug/mL AT 4 HOURS AFTER INGESTION AND >50 ug/mL AT 12 HOURS AFTER INGESTION ARE OFTEN ASSOCIATED WITH TOXIC REACTIONS. Performed at [facility], [place] [address] [place], [place] [address]   Salicylate level     Status: None   Collection Time: 01/25/18  9:36 PM  Result Value Ref Range   Salicylate Lvl <4.6 2.8 - 30.0 mg/dL    Comment: Performed at [facility], [place] [address], [place], [place] [address]  I-Stat CG4 Lactic Acid, ED     Status: None   Collection Time: 01/25/18  9:37 PM  Result Value Ref Range   Lactic Acid, Venous 0.77 0.5 - 1.9 mmol/L  CBC     Status: Abnormal   Collection Time: 01/26/18  5:25 AM  Result Value Ref Range   WBC 12.8 (H) 4.0 - 10.5 K/uL   RBC 5.09 3.87 - 5.11 MIL/uL   Hemoglobin 15.5 (H) 12.0 - 15.0 g/dL  HCT 46.3 (H) 36.0 - 46.0 %   MCV 91.0 78.0 - 100.0 fL   MCH 30.5 26.0 - 34.0 pg   MCHC 33.5 30.0 - 36.0 g/dL   RDW 14.9 11.5 - 15.5 %   Platelets 345 150 - 400 K/uL    Comment: Performed at [facility], [place] [address], [place], [place] [address]  Comprehensive metabolic panel     Status: Abnormal   Collection Time: 01/26/18  5:25 AM  Result Value Ref Range   Sodium 141 135 - 145 mmol/L   Potassium 4.3 3.5 - 5.1 mmol/L    Comment: DELTA CHECK NOTED NO VISIBLE HEMOLYSIS    Chloride 109 101 - 111 mmol/L   CO2 22 22 - 32 mmol/L   Glucose, Bld 122 (H) 65 - 99 mg/dL   BUN 8 6 - 20 mg/dL   Creatinine, Ser 0.73 0.44 - 1.00 mg/dL   Calcium 8.4 (L) 8.9 - 10.3 mg/dL   Total Protein 6.2 (L) 6.5 - 8.1 g/dL   Albumin 3.3 (L) 3.5 - 5.0 g/dL   AST 21 15 - 41 U/L   ALT 26 14 - 54 U/L   Alkaline Phosphatase 109 38 - 126 U/L   Total Bilirubin 0.1 (L) 0.3 - 1.2 mg/dL   GFR calc non Af Amer >60 >60 mL/min   GFR calc Af Amer >60 >60 mL/min    Comment: (NOTE) The eGFR has been calculated using the CKD EPI equation. This calculation has not been validated in all clinical situations. eGFR's persistently <60 mL/min signify possible Chronic Kidney Disease.    Anion gap 10 5 - 15    Comment: Performed at [facility], [place] [address], [place], [place] [address]  Magnesium     Status: None   Collection Time: 01/26/18  5:25 AM  Result Value Ref Range   Magnesium 1.9 1.7 - 2.4 mg/dL    Comment: Performed at [facility], [place] [address], [place], [place] [address]    Current Facility-Administered Medications  Medication Dose Route Frequency Provider Last Rate Last Dose  . 0.9 %  sodium chloride infusion   Intravenous Continuous Norval Morton, MD   Stopped at 01/26/18 [phone]  . acetaminophen (TYLENOL) tablet 650 mg  650 mg Oral Q6H PRN Norval Morton, MD       Or  . acetaminophen (TYLENOL) suppository 650 mg  650 mg Rectal Q6H PRN Tamala Julian, Rondell A, MD      . diclofenac sodium (VOLTAREN) 1 % transdermal gel 2 g  2 g Topical PRN Smith, Rondell A, MD      . enoxaparin (LOVENOX) injection 40 mg  40 mg Subcutaneous QHS Smith, Rondell A, MD   40 mg at 01/26/18 0041  . famotidine (PEPCID) tablet 20 mg  20 mg Oral BID Fuller Plan A, MD   20 mg at 01/26/18 0005  . gabapentin (NEURONTIN) capsule 400 mg  400 mg Oral Daily Smith, Rondell A, MD      . ipratropium-albuterol (DUONEB) 0.5-2.5 (3) MG/3ML nebulizer solution 3 mL  3 mL Nebulization Q6H PRN Smith, Rondell A, MD      . LORazepam (ATIVAN) injection 1-2 mg  1-2 mg Intramuscular Q2H PRN Short, Mackenzie, MD      . meloxicam (MOBIC) tablet 15 mg  15 mg Oral Daily Smith, Rondell A, MD      . nicotine (NICODERM CQ - dosed in mg/24 hours) patch 21 mg  21 mg  Transdermal Daily Fuller Plan A, MD   21 mg at 01/26/18 0005  . OLANZapine (ZYPREXA) injection 10 mg  10 mg Intramuscular Once Short, Noah Delaine, MD      . ondansetron [facility]) tablet 4 mg  4 mg Oral Q6H PRN Fuller Plan A, MD       Or  . ondansetron (ZOFRAN) injection 4 mg  4 mg Intravenous Q6H PRN Smith, Rondell A, MD      . sodium chloride flush (NS) 0.9 % injection 3 mL  3 mL Intravenous Q12H Norval Morton, MD       Current Outpatient Medications  Medication Sig Dispense Refill  . albuterol  (PROVENTIL HFA;VENTOLIN HFA) 108 (90 Base) MCG/ACT inhaler Inhale 2 puffs into the lungs every 6 (six) hours as needed for wheezing or shortness of breath.    . diclofenac sodium (VOLTAREN) 1 % GEL Apply 2 g topically as needed.     . DULoxetine (CYMBALTA) 60 MG capsule Take 60 mg by mouth daily.    Marland Kitchen gabapentin (NEURONTIN) 800 MG tablet Take 400 mg by mouth daily.     . meloxicam (MOBIC) 15 MG tablet Take 15 mg by mouth daily.    . mirtazapine (REMERON SOL-TAB) 15 MG disintegrating tablet Take 15 mg by mouth at bedtime.    . ranitidine (ZANTAC) 150 MG tablet Take 150 mg by mouth 2 (two) times daily.    . Vitamin D, Ergocalciferol, (DRISDOL) 50000 units CAPS capsule Take 50,000 Units by mouth every 7 (seven) days.    Marland Kitchen LORazepam (ATIVAN) 0.5 MG tablet Take 0.5 mg by mouth every 8 (eight) hours.      Musculoskeletal: Strength & Muscle Tone: within normal limits Gait & Station: UTA due to patient lying in bed. Patient leans: N/A  Psychiatric Specialty Exam: Physical Exam  Nursing note and vitals reviewed. Constitutional: She appears well-developed and well-nourished.  HENT:  Head: Normocephalic and atraumatic.  Neck: Normal range of motion.  Respiratory: Effort normal.  Musculoskeletal: Normal range of motion.  Neurological: She is alert.  Oriented to person and place.   Skin: No rash noted.  Psychiatric: She has a normal mood and affect. Her speech is normal. Thought content normal. Cognition and memory are impaired. She expresses impulsivity. She is inattentive.    Review of Systems  Constitutional: Negative for chills and fever.  Cardiovascular: Negative for chest pain.  Gastrointestinal: Negative for abdominal pain, constipation, diarrhea, nausea and vomiting.  Psychiatric/Behavioral: Positive for depression and hallucinations (visual hallucinations). Negative for substance abuse and suicidal ideas. The patient is nervous/anxious and has insomnia.   All other systems reviewed  and are negative.   Blood pressure 115/68, pulse (!) 104, temperature 98.2 F (36.8 C), temperature source Rectal, resp. rate (!) 24, height '5\' 2"'  (1.575 m), SpO2 93 %.There is no height or weight on file to calculate BMI.  General Appearance: Fairly Groomed, middle aged, Caucasian female who appears older than her stated age and is wearing a hospital gown, sitting up in bed while eating her lunch. NAD.   Eye Contact:  Good  Speech:  Clear and Coherent and Normal Rate  Volume:  Normal  Mood:  Anxious and Depressed  Affect:  Appropriate and Full Range  Thought Process:  Goal Directed, Linear and Descriptions of Associations: Intact  Orientation:  Other:  Person and place.  Thought Content:  Logical  Suicidal Thoughts:  No  Homicidal Thoughts:  No  Memory:  Immediate;   Good Recent;   Fair  Remote;   Good  Judgement:  Fair  Insight:  Fair  Psychomotor Activity:  Normal  Concentration:  Concentration: Fair and Attention Span: Fair  Recall:  [employer] of Knowledge:  Fair  Language:  Fair  Akathisia:  No  Handed:  Right  AIMS (if indicated):   N/A  Assets:  Agricultural consultant Housing Social Support  ADL's:  Intact  Cognition: Patient demonstrates deficits in short term memory.   Sleep:   Recently poor.   Assessment:  Angela Kane is a 57 y.o. female who was admitted with altered mental status and agitation requiring Ativan. Patient does not recall changes in her mental status but her daughters report onset of symptoms over the past 2 days. She became increasingly paranoid with visual hallucinations and insomnia in the setting of recent medication changes. She has no prior history of psychosis. Her PCP and pharmacy were contacted for collateral information and verification of medications respectively. It does not appear that the patient was taking her medications as prescribed and this may have led to altered mental status as well as her current  medical condition for which she is receiving treatment. Her medications should be restarted as prescribed by her PCP once her mental status improves.   Treatment Plan Summary: -Restart Cymbalta 40 mg daily for depression and anxiety. Increased to 60 mg daily on last visit with PCP on 1/8 but recommend starting at prior dose since unclear if increased dose caused side effects that led to current presentation.  -Restart Gabapentin 800 mg TID for anxiety.  -Restart Trileptal 150 mg BID for mood stabilization.  -Discontinue Remeron since this medication was most recently started and it is unclear if she experienced side effects from medication that contributed to current presentation. -Recommend Haldol 0.5 mg daily PRN for agitation due to altered mental status. -Start Melatonin 3-6 mg qhs for insomnia. Give Trazodone 50 mg qhs PRN for insomnia if Melatonin is ineffective.  -Avoid IV use since it has the highest risk for QTc prolongation. Advise judicious use due to prolonged QTc of 497 on 2/11. Continue to closely monitor.  -Patient should follow up with her PCP for further medication management following discharge.   -Psychiatry will sign off on patient at this time. Please consult psychiatry again as needed.   Disposition: No evidence of imminent risk to self or others at present.   Patient does not meet criteria for psychiatric inpatient admission.  Faythe Dingwall, DO 01/26/2018 10:35 AM

## 2018-01-26 NOTE — ED Notes (Signed)
Pt calm at this time and restraints removed. Pt has minor abrasions to right hand and redness on forearms from resisting the restraints. No other injuries noted and pt able to perform range of motion with no difficulties.

## 2018-01-26 NOTE — ED Notes (Signed)
Pt altered and combative and fighting staff. Grand daughter at bedside. Pt not cooperating with family or staff members at this time. Hospitalist paged. Waiting for response.

## 2018-01-26 NOTE — Care Management Obs Status (Signed)
[facility] NOTIFICATION   Patient Details  Name: Aislin Onofre MRN: [id number] Date of Birth: 1961/12/10   Medicare Observation Status Notification Given:  Yes    Clariza Sickman, Benjaman Lobe, RN 01/26/2018, 12:09 PM

## 2018-01-26 NOTE — Evaluation (Signed)
Physical Therapy Evaluation Patient Details Name: Angela Kane MRN: [id number] DOB: October 26, 1961 Today's Date: 01/26/2018   History of Present Illness   Angela Kane is a 57 y.o. female with medical history significant of HLD, tobacco abuse, anxiety, depression, goiter, seizure disorder; who presents with complaints of AMS  and paranoid  Clinical Impression  Pt admitted with above diagnosis. Pt currently with functional limitations due to the deficits listed below (see PT Problem List).  Pt is weak and with decr balance  placing her at risk for falls, will benefit from PT in acute setting and recommend HHPT, if pt does not improve cognitively may need SNF; Pt will benefit from skilled PT to increase their independence and safety with mobility to allow discharge to the venue listed below.      Follow Up Recommendations Home health PT;Supervision for mobility/OOB    Equipment Recommendations  None recommended by PT    Recommendations for Other Services       Precautions / Restrictions Precautions Precautions: Fall      Mobility  Bed Mobility Overal bed mobility: Needs Assistance Bed Mobility: Supine to Sit;Sit to Supine     Supine to sit: Min assist Sit to supine: Min assist   General bed mobility comments: assist to elevated trunk and to safely bring LEs onto bed; pt requires incr time and  cues for sequencing  Transfers   Equipment used: 2 person hand held assist             General transfer comment: cues for safety and sequenicng of task  Ambulation/Gait Ambulation/Gait assistance: Min assist;+2 safety/equipment;+2 physical assistance Ambulation Distance (Feet): 4 Feet Assistive device: 2 person hand held assist       General Gait Details: lateral steps along EOB, pt very unsteady and requiring assist to balance while wt shifting; HR max of 125, SpO2= 92-97% on RA  Stairs            Wheelchair Mobility    Modified Rankin (Stroke  Patients Only)       Balance Overall balance assessment: Needs assistance;History of Falls Sitting-balance support: No upper extremity supported;Feet supported Sitting balance-Leahy Scale: Fair Sitting balance - Comments: close supervision     Standing balance-Leahy Scale: Poor Standing balance comment: reliant on UE and external support                             Pertinent Vitals/Pain Pain Assessment: No/denies pain    Home Living Family/patient expects to be discharged to:: Private residence Living Arrangements: Alone(children and grandchildren live nearby) Available Help at Discharge: Family Type of Home: Apartment Home Access: Level entry     Home Layout: One level Home Equipment: Environmental consultant - 2 wheels;Cane - single point      Prior Function Level of Independence: Independent;Independent with assistive device(s)         Comments: amb with cane Granddaughter gives info above as pt is lethargic initially     Hand Dominance        Extremity/Trunk Assessment   Upper Extremity Assessment Upper Extremity Assessment: Generalized weakness    Lower Extremity Assessment Lower Extremity Assessment: Generalized weakness       Communication   Communication: No difficulties  Cognition Arousal/Alertness: Awake/alert Behavior During Therapy: WFL for tasks assessed/performed Overall Cognitive Status: Impaired/Different from baseline Area of Impairment: Orientation                 Orientation  Level: Disoriented to;Situation;Time     Following Commands: Follows one step commands with increased time;Follows multi-step commands consistently     Problem Solving: Slow processing;Decreased initiation;Difficulty sequencing;Requires verbal cues;Requires tactile cues        General Comments      Exercises     Assessment/Plan    PT Assessment Patient needs continued PT services  PT Problem List Decreased strength;Decreased activity  tolerance;Decreased balance;Decreased mobility;Decreased safety awareness;Decreased cognition       PT Treatment Interventions DME instruction;Gait training;Functional mobility training;Therapeutic activities;Therapeutic exercise;Patient/family education;Balance training    PT Goals (Current goals can be found in the Care Plan section)  Acute Rehab PT Goals Patient Stated Goal: to get better PT Goal Formulation: With patient/family Time For Goal Achievement: 02/09/18 Potential to Achieve Goals: Good    Frequency Min 3X/week   Barriers to discharge        Co-evaluation               AM-PAC PT "6 Clicks" Daily Activity  Outcome Measure Difficulty turning over in bed (including adjusting bedclothes, sheets and blankets)?: Unable Difficulty moving from lying on back to sitting on the side of the bed? : Unable Difficulty sitting down on and standing up from a chair with arms (e.g., wheelchair, bedside commode, etc,.)?: Unable Help needed moving to and from a bed to chair (including a wheelchair)?: A Lot Help needed walking in hospital room?: A Lot Help needed climbing 3-5 steps with a railing? : A Lot 6 Click Score: 9    End of Session Equipment Utilized During Treatment: Gait belt Activity Tolerance: Patient limited by fatigue Patient left: in bed;with call bell/phone within reach;with family/visitor present   PT Visit Diagnosis: Difficulty in walking, not elsewhere classified (R26.2);Unsteadiness on feet (R26.81)    Time: 1051-1105 PT Time Calculation (min) (ACUTE ONLY): 14 min   Charges:   PT Evaluation $PT Eval Low Complexity: 1 Low     PT G CodesKenyon Kane, PT Pager: [phone] 01/26/2018   [facility] [place] 01/26/2018, 1:56 PM

## 2018-01-26 NOTE — ED Notes (Signed)
Attempted to call report to the number provided with no success.

## 2018-01-27 DIAGNOSIS — G92 Toxic encephalopathy: Secondary | ICD-10-CM

## 2018-01-27 DIAGNOSIS — G9340 Encephalopathy, unspecified: Secondary | ICD-10-CM

## 2018-01-27 DIAGNOSIS — R338 Other retention of urine: Secondary | ICD-10-CM

## 2018-01-27 DIAGNOSIS — R9431 Abnormal electrocardiogram [ECG] [EKG]: Secondary | ICD-10-CM

## 2018-01-27 DIAGNOSIS — D72829 Elevated white blood cell count, unspecified: Secondary | ICD-10-CM

## 2018-01-27 LAB — BASIC METABOLIC PANEL
Anion gap: 7 (ref 5–15)
BUN: 12 mg/dL (ref 6–20)
CALCIUM: 8.6 mg/dL — AB (ref 8.9–10.3)
CO2: 23 mmol/L (ref 22–32)
CREATININE: 0.73 mg/dL (ref 0.44–1.00)
Chloride: 114 mmol/L — ABNORMAL HIGH (ref 101–111)
GFR calc non Af Amer: >60 mL/min (ref >60)
Glucose, Bld: 112 mg/dL — ABNORMAL HIGH (ref 65–99)
Potassium: 3.6 mmol/L (ref 3.5–5.1)
SODIUM: 144 mmol/L (ref 135–145)

## 2018-01-27 LAB — MAGNESIUM: MAGNESIUM: 2.1 mg/dL (ref 1.7–2.4)

## 2018-01-27 MED ORDER — MAGNESIUM SULFATE 2 GM/50ML IV SOLN: 2.0000 g | Freq: Once | INTRAVENOUS | Status: DC

## 2018-01-27 MED ORDER — MELATONIN 1 MG PO TABS
1.0000 mg | ORAL_TABLET | Freq: Every day | ORAL | Status: DC
  Administered 2018-01-27: 1 mg via ORAL
  Filled 2018-01-27 (×2): qty 1

## 2018-01-27 NOTE — Progress Notes (Signed)
Patient experienced another event this AM. At beginning of shift, pt was A&O, ate breakfast, family at bedside. RN was later called to room after pt began calling for "tater tot" and speaking things that do not make sense. Pt's entire body was shaking in her bed along with her arms and head. Pt repeatedly attempted to pull her PIV and grab at things around her. Pt also persistently attempted to get OOB. Pt then began kicking very hard at her footboard. MD Feliz-Ortiz on floor and made aware- PRN Haldol given at 0919. Episode lasted around 30 minutes total. Pt then became A&O again and was able to take all PO medications. Pt also ordered lunch with granddaughter. Will continue to monitor closely.

## 2018-01-27 NOTE — Progress Notes (Signed)
Physical Therapy Treatment Patient Details Name: Angela Kane MRN: [id number] DOB: 1960-12-22 Today's Date: 01/27/2018    History of Present Illness  Angela Kane is a 57 y.o. female with medical history significant of HLD, tobacco abuse, anxiety, depression, goiter, seizure disorder; who presents with complaints of AMS  and paranoid    PT Comments    Pt is a retired Therapist, sports.  Pt was in bed napping on 2 lts nasal.  Assisted OOB to amb to bathroom then in hallway a limited distance.  Very unsteady gait with chronic back pain.  Follow Up Recommendations  Home health PT;Supervision for mobility/OOB     Equipment Recommendations  None recommended by PT    Recommendations for Other Services       Precautions / Restrictions Precautions Precautions: Fall Restrictions Weight Bearing Restrictions: No    Mobility  Bed Mobility Overal bed mobility: Needs Assistance Bed Mobility: Supine to Sit     Supine to sit: Min guard     General bed mobility comments: increased time due to back pain and extra assist to complete scooting to EOB  Transfers Overall transfer level: Needs assistance Equipment used: 2 person hand held assist Transfers: Sit to/from Stand;Stand Pivot Transfers Sit to Stand: Min assist;Min guard Stand pivot transfers: Min guard;Min assist       General transfer comment: unsteady with VC's for safety with turn completion and environmental tripping hazzards  Ambulation/Gait Ambulation/Gait assistance: Min assist;+2 safety/equipment;+2 physical assistance Ambulation Distance (Feet): 28 Feet Assistive device: 1 person hand held assist;Rolling walker (2 wheeled) Gait Pattern/deviations: Step-to pattern;Decreased step length - left;Decreased stance time - right Gait velocity: decreased   General Gait Details: very unsteady gait with need for AD.  High Fall Risk.  Amb on RA sats avg 97%.     Stairs            Wheelchair Mobility    Modified  Rankin (Stroke Patients Only)       Balance                                            Cognition Arousal/Alertness: Awake/alert Behavior During Therapy: WFL for tasks assessed/performed Overall Cognitive Status: Within Functional Limits for tasks assessed                                 General Comments: slightly groggy, just woke from a nap      Exercises      General Comments        Pertinent Vitals/Pain Pain Assessment: Faces Faces Pain Scale: Hurts even more Pain Location: chronic back pain Pain Descriptors / Indicators: Constant;Grimacing Pain Intervention(s): Monitored during session;Repositioned    Home Living                      Prior Function            PT Goals (current goals can now be found in the care plan section) Progress towards PT goals: Progressing toward goals    Frequency    Min 3X/week      PT Plan      Co-evaluation              AM-PAC PT "6 Clicks" Daily Activity  Outcome Measure  Difficulty turning over in bed (including adjusting  bedclothes, sheets and blankets)?: Unable Difficulty moving from lying on back to sitting on the side of the bed? : Unable Difficulty sitting down on and standing up from a chair with arms (e.g., wheelchair, bedside commode, etc,.)?: Unable Help needed moving to and from a bed to chair (including a wheelchair)?: A Lot Help needed walking in hospital room?: A Lot Help needed climbing 3-5 steps with a railing? : A Lot 6 Click Score: 9    End of Session Equipment Utilized During Treatment: Gait belt Activity Tolerance: Patient limited by fatigue Patient left: in bed;with call bell/phone within reach;with family/visitor present   PT Visit Diagnosis: Difficulty in walking, not elsewhere classified (R26.2);Unsteadiness on feet (R26.81)     Time: 9470-9628 PT Time Calculation (min) (ACUTE ONLY): 24 min  Charges:  $Gait Training: 8-22 mins $Therapeutic  Activity: 8-22 mins                    G Codes:       {Yarielys Beed  PTA [facility]  Acute  Rehab Pager      [phone]

## 2018-01-27 NOTE — Progress Notes (Signed)
PT Cancellation Note  Patient Details Name: Kanita Delage MRN: [id number] DOB: Sep 21, 1961   Cancelled Treatment:    Reason Eval/Treat Not Completed: Medical issues which prohibited therapy; RN reports just had Haldol and prefers pt not to get OOB due to possible weakness.  Will attempt later today if time permits.    Reginia Naas 01/27/2018, 10:30 AM  Magda Kiel, PT [phone] 01/27/2018

## 2018-01-27 NOTE — Progress Notes (Signed)
[facility] PROGRESS NOTE    Progress Note  Angela Kane  FUX:[id number] DOB: Aug 16, 1961 DOA: 01/25/2018 PCP: Patient, No Pcp Per     Brief Narrative:   Angela Kane is an 57 y.o. female past medical history of anxiety depression seizure tobacco abuse who presents with paranoia and altered mental status she apparently had some changes to her medication about a week by her PCP secondary to difficulties sleeping and some increasing anxiety, she also had some paranoia the patient might have gotten confused with her medication, on admission she was severely confused started calling her dogs and cat nonsensical named.  Anxious workup has been negative till date, she was put in restraints on admission and IV 6 approximately and she calmed down psychiatry was consulted.  Assessment/Plan:   Acute Psychosis (HCC)/ Toxic encephalopathy Likely due too numerous changes in her psychiatric medication and probably compliance.  There are no signs of infection stroke or seizures. Psychiatry was consulted recommended to resume medications Cymbalta 40 mg gabapentin 800 Trileptal 150. This morning she was agitated, she was given Haldol IV, and is now significantly improved.  Leukocytosis:  Likely stress demargination in the setting of dehydration and psychosis. Blood culture negative till date, UA negative chest x-ray negative.  Acute urinary retention: Likely due to medication side effects resolved within and out catheterization.  Hypokalemia: Repleted orally now resolved. Magnesium was 1.9 we will give an additional IV to try to bring magnesium above 2 Potassium is 4.3. QTC was 497, females usually run around 480.  Hemoconcentration: Improved with IV fluid hydration.  Chronic pain: Continue Voltaren.  History of seizure disorder: Family does not report any seizure activities in the last several years, she has no loss control of sphincters.  Thyroid nodule: TSH is 2.8  she will need further follow-up as an outpatient.  DVT prophylaxis: lovenox Family Communication:daughter Disposition Plan/Barrier to D/C: home in am Code Status:     Code Status Orders  (From admission, onward)        Start     Ordered   01/25/18 2240  Full code  Continuous     01/25/18 2242    Code Status History    Date Active Date Inactive Code Status Order ID Comments User Context   This patient has a current code status but no historical code status.        IV Access:    Peripheral IV   Procedures and diagnostic studies:   Ct Head Wo Contrast  Result Date: 01/25/2018 CLINICAL DATA:  57 year old female with progressive unexplained altered mental status, confusion and paranoia for 2 days. Hallucinations today. Fall while being transported to the ED. EXAM: CT HEAD WITHOUT CONTRAST CT CERVICAL SPINE WITHOUT CONTRAST TECHNIQUE: Multidetector CT imaging of the head and cervical spine was performed following the standard protocol without intravenous contrast. Multiplanar CT image reconstructions of the cervical spine were also generated. COMPARISON:  Prior thyroid ultrasounds FINDINGS: CT HEAD FINDINGS Brain: No midline shift, ventriculomegaly, mass effect, evidence of mass lesion, intracranial hemorrhage or evidence of cortically based acute infarction. Gray-white matter differentiation is within normal limits throughout the brain. No encephalomalacia identified. Vascular: Mild Calcified atherosclerosis at the skull base. No suspicious intracranial vascular hyperdensity. Skull: No acute osseous abnormality identified. Sinuses/Orbits: Clear. Other: Visualized orbit soft tissues are within normal limits. No acute scalp soft tissue finding. Mild left forehead scalp scarring. CT CERVICAL SPINE FINDINGS Alignment: Straightening of cervical lordosis. Bilateral posterior element alignment is within normal limits. Cervicothoracic  junction alignment is within normal limits. Skull base and  vertebrae: Osteopenia. Visualized skull base is intact. No atlanto-occipital dissociation. Congenital incomplete ossification of the posterior C1 ring. No cervical spine fracture identified. Soft tissues and spinal canal: No prevertebral fluid or swelling. No visible canal hematoma. Disc levels:  Normal for age. Upper chest: T1 spina bifida occulta (normal variant). Visible upper thoracic levels appear intact. Apical lung scarring greater on the right. Chronic right thyroid lower pole nodule. IMPRESSION: 1.  Normal noncontrast CT appearance of the brain. 2. Negative for age CT appearance of the cervical spine. 3. Chronic thyroid goiter. Electronically Signed   By: Genevie Ann M.D.   On: 01/25/2018 20:19   Ct Cervical Spine Wo Contrast  Result Date: 01/25/2018 CLINICAL DATA:  57 year old female with progressive unexplained altered mental status, confusion and paranoia for 2 days. Hallucinations today. Fall while being transported to the ED. EXAM: CT HEAD WITHOUT CONTRAST CT CERVICAL SPINE WITHOUT CONTRAST TECHNIQUE: Multidetector CT imaging of the head and cervical spine was performed following the standard protocol without intravenous contrast. Multiplanar CT image reconstructions of the cervical spine were also generated. COMPARISON:  Prior thyroid ultrasounds FINDINGS: CT HEAD FINDINGS Brain: No midline shift, ventriculomegaly, mass effect, evidence of mass lesion, intracranial hemorrhage or evidence of cortically based acute infarction. Gray-white matter differentiation is within normal limits throughout the brain. No encephalomalacia identified. Vascular: Mild Calcified atherosclerosis at the skull base. No suspicious intracranial vascular hyperdensity. Skull: No acute osseous abnormality identified. Sinuses/Orbits: Clear. Other: Visualized orbit soft tissues are within normal limits. No acute scalp soft tissue finding. Mild left forehead scalp scarring. CT CERVICAL SPINE FINDINGS Alignment: Straightening of  cervical lordosis. Bilateral posterior element alignment is within normal limits. Cervicothoracic junction alignment is within normal limits. Skull base and vertebrae: Osteopenia. Visualized skull base is intact. No atlanto-occipital dissociation. Congenital incomplete ossification of the posterior C1 ring. No cervical spine fracture identified. Soft tissues and spinal canal: No prevertebral fluid or swelling. No visible canal hematoma. Disc levels:  Normal for age. Upper chest: T1 spina bifida occulta (normal variant). Visible upper thoracic levels appear intact. Apical lung scarring greater on the right. Chronic right thyroid lower pole nodule. IMPRESSION: 1.  Normal noncontrast CT appearance of the brain. 2. Negative for age CT appearance of the cervical spine. 3. Chronic thyroid goiter. Electronically Signed   By: Genevie Ann M.D.   On: 01/25/2018 20:19   Dg Chest Port 1 View  Result Date: 01/25/2018 CLINICAL DATA:  Admission chest radiograph. Acute onset of encephalopathy. EXAM: PORTABLE CHEST 1 VIEW COMPARISON:  Chest radiograph performed 04/17/2017 FINDINGS: The lungs are well-aerated and clear. There is no evidence of focal opacification, pleural effusion or pneumothorax. The cardiomediastinal silhouette is within normal limits. No acute osseous abnormalities are seen. IMPRESSION: No acute cardiopulmonary process seen. Electronically Signed   By: Garald Balding M.D.   On: 01/25/2018 22:44     Medical Consultants:    None.  Anti-Infectives:   none  Subjective:    Lorrin Jackson she wants to go home she relates she feels great.  Objective:    Vitals:   01/26/18 1623 01/26/18 1903 01/26/18 1919 01/27/18 0552  BP: 110/82 118/81 109/69 127/80  Pulse: 84 76 75 85  Resp: 20  20 20   Temp: 98.2 F (36.8 C)   98.1 F (36.7 C)  TempSrc: Oral   Oral  SpO2: 98% 98%  96%  Weight: 63.5 kg (140 lb)     Height: 5'  2" (1.575 m)       Intake/Output Summary (Last 24 hours) at 01/27/2018  0931 Last data filed at 01/27/2018 0846 Gross per 24 hour  Intake 1918.75 ml  Output 500 ml  Net 1418.75 ml   Filed Weights   01/26/18 1623  Weight: 63.5 kg (140 lb)    Exam: General exam: In no acute distress. Respiratory system: Good air movement and clear to auscultation. Cardiovascular system: S1 & S2 heard, RRR.   Gastrointestinal system: Abdomen is nondistended, soft and nontender.  Extremities: No pedal edema. Skin: No rashes, lesions or ulcers  Data Reviewed:    Labs: Basic Metabolic Panel: Recent Labs  Lab 01/25/18 1914 01/26/18 0525  NA 139 141  K 3.0* 4.3  CL 103 109  CO2 22 22  GLUCOSE 118* 122*  BUN 11 8  CREATININE 0.71 0.73  CALCIUM 9.7 8.4*  MG  --  1.9   GFR Estimated Creatinine Clearance: 68 mL/min (by C-G formula based on SCr of 0.73 mg/dL). Liver Function Tests: Recent Labs  Lab 01/25/18 1914 01/26/18 0525  AST 27 21  ALT 34 26  ALKPHOS 145* 109  BILITOT 0.5 0.1*  PROT 7.9 6.2*  ALBUMIN 4.3 3.3*   No results for input(s): LIPASE, AMYLASE in the last 168 hours. Recent Labs  Lab 01/25/18 1915  AMMONIA 34   Coagulation profile No results for input(s): INR, PROTIME in the last 168 hours.  CBC: Recent Labs  Lab 01/25/18 1914 01/26/18 0525  WBC 17.5* 12.8*  NEUTROABS 9.3*  --   HGB 17.7* 15.5*  HCT 50.8* 46.3*  MCV 89.8 91.0  PLT 382 345   Cardiac Enzymes: No results for input(s): CKTOTAL, CKMB, CKMBINDEX, TROPONINI in the last 168 hours. BNP (last 3 results) No results for input(s): PROBNP in the last 8760 hours. CBG: Recent Labs  Lab 01/26/18 1909  GLUCAP 129*   D-Dimer: No results for input(s): DDIMER in the last 72 hours. Hgb A1c: No results for input(s): HGBA1C in the last 72 hours. Lipid Profile: No results for input(s): CHOL, HDL, LDLCALC, TRIG, CHOLHDL, LDLDIRECT in the last 72 hours. Thyroid function studies: Recent Labs    01/25/18 1924  TSH 2.811   Anemia work up: No results for input(s):  VITAMINB12, FOLATE, FERRITIN, TIBC, IRON, RETICCTPCT in the last 72 hours. Sepsis Labs: Recent Labs  Lab 01/25/18 1914 01/25/18 2137 01/26/18 0525  WBC 17.5*  --  12.8*  LATICACIDVEN  --  0.77  --    Microbiology No results found for this or any previous visit (from the past 240 hour(s)).   Medications:   . DULoxetine  40 mg Oral Daily  . enoxaparin (LOVENOX) injection  40 mg Subcutaneous QHS  . famotidine  20 mg Oral BID  . gabapentin  800 mg Oral TID  . meloxicam  15 mg Oral Daily  . nicotine  21 mg Transdermal Daily  . OXcarbazepine  150 mg Oral BID  . sodium chloride flush  3 mL Intravenous Q12H   Continuous Infusions: . sodium chloride 75 mL/hr at 01/26/18 1607      LOS: 1 day   [facility] Pager [phone]  *Please refer to [email or web address], password [email or web address] to get updated schedule on who will round on this patient, as hospitalists switch teams weekly. If 7PM-7AM, please contact night-coverage at [email or web address], password [email or web address] for any overnight needs.  01/27/2018, 9:31 AM

## 2018-01-28 MED ORDER — OXCARBAZEPINE 150 MG PO TABS: 150.0000 mg | ORAL_TABLET | Freq: Two times a day (BID) | ORAL | 0 refills | Status: AC

## 2018-01-28 MED ORDER — NICOTINE 21 MG/24HR TD PT24: 21.0000 mg | MEDICATED_PATCH | Freq: Every day | TRANSDERMAL | 0 refills | Status: DC

## 2018-01-28 MED ORDER — MELATONIN 1 MG PO TABS: 1.0000 mg | ORAL_TABLET | Freq: Every day | ORAL | 0 refills | Status: DC

## 2018-01-28 MED ORDER — DULOXETINE HCL 40 MG PO CPEP: 40.0000 mg | ORAL_CAPSULE | Freq: Every day | ORAL | 1 refills | Status: DC

## 2018-01-28 MED ORDER — GABAPENTIN 400 MG PO CAPS: 800.0000 mg | ORAL_CAPSULE | Freq: Three times a day (TID) | ORAL | 0 refills | Status: DC

## 2018-01-28 NOTE — Progress Notes (Signed)
There are no HH needs at present time.

## 2018-01-28 NOTE — Discharge Summary (Signed)
Physician Discharge Summary  Angela Kane XBJ:[id number] DOB: Jul 08, 1961 DOA: 01/25/2018  PCP: Angela Kane  Admit date: 01/25/2018 Discharge date: 01/28/2018  Admitted From: home Disposition:  Home  Recommendations for Outpatient Follow-up:  1. Follow up with PCP in 1-2 weeks 2. Follow-up on thyroid nodule will probably need a thyroid scan   Home Health:non Equipment/Devices:none  Discharge Condition:stable CODE STATUS:full Diet recommendation: Heart Healthy / Carb Modified / Regular / Dysphagia   Brief/Interim Summary: 57 y.o. female past medical history of anxiety depression seizure tobacco abuse who presents with paranoia and altered mental status she apparently had some changes to her medication about a week by her PCP secondary to difficulties sleeping and some increasing anxiety, she also had some paranoia the patient might have gotten confused with her medication, on admission she was severely confused started calling her dogs and cat nonsensical named.  Anxious workup has been negative till date, she was put in restraints on admission and IV 6 approximately and she calmed down psychiatry was consulted.  Discharge Diagnoses:  Principal Problem:   Altered mental status Active Problems:   Toxic encephalopathy   Acute urinary retention   Leukocytosis   Prolonged Q-T interval on ECG   Tobacco abuse   GERD (gastroesophageal reflux disease)   Psychosis (HCC)  Acute psychosis/toxic encephalopathy: Likely due to numerous changes to her psychiatric medication. Psychiatry was consulted who recommended Cymbalta 60 mg, Neurontin 800 mg, and Trileptal 150 mg.   After 2 days she stabilized and her family relates she returned to baseline.  Leukocytosis: No signs of infectious etiology likely to stress demargination.  Acute urinary retention: Likely due to medication side effect this resolved.  Hypokalemia: These were repleted  orally.  Hemoconcentration: Improved with IV fluid hydration.  Chronic pain: Continue Voltaren.  History of seizure disorder: No seizure activity in house.  Thyroid nodule:  TSH 2.8 she will need further evaluation is  Discharge Instructions  Discharge Instructions    Diet - low sodium heart healthy   Complete by:  As directed    Increase activity slowly   Complete by:  As directed      Allergies as of 01/28/2018      Reactions   Amaranth (fd&c Red #2) Hypertension   Metrizamide Hypertension   IV DYE   Ativan [lorazepam]    Betadine [povidone Iodine] Itching   "Makes the inside of my mouth itch, makes me itch internally, and it makes me want to scratch myself raw "   Latex Itching   Penicillins    Has patient had a PCN reaction causing immediate rash, facial/tongue/throat swelling, SOB or lightheadedness with hypotension: Yes Has patient had a PCN reaction causing severe rash involving mucus membranes or skin necrosis: Unknown Has patient had a PCN reaction that required hospitalization: Unknown Has patient had a PCN reaction occurring within the last 10 years:No If all of the above answers are "NO", then may proceed with Cephalosporin use. **Childhood alergy      Medication List    STOP taking these medications   gabapentin 800 MG tablet Commonly known as:  NEURONTIN Replaced by:  gabapentin 400 MG capsule   meloxicam 15 MG tablet Commonly known as:  MOBIC   mirtazapine 15 MG disintegrating tablet Commonly known as:  REMERON SOL-TAB     TAKE these medications   albuterol 108 (90 Base) MCG/ACT inhaler Commonly known as:  PROVENTIL HFA;VENTOLIN HFA Inhale 2 puffs into the lungs every 6 (six) hours as  needed for wheezing or shortness of breath.   diclofenac sodium 1 % Gel Commonly known as:  VOLTAREN Apply 2 g topically as needed.   DULoxetine HCl 40 MG Cpep Take 40 mg by mouth daily. Start taking on:  01/29/2018 What changed:    medication  strength  how much to take   gabapentin 400 MG capsule Commonly known as:  NEURONTIN Take 2 capsules (800 mg total) by mouth 3 (three) times daily. Replaces:  gabapentin 800 MG tablet   LORazepam 0.5 MG tablet Commonly known as:  ATIVAN Take 0.5 mg by mouth every 8 (eight) hours.   Melatonin 1 MG Tabs Take 1 tablet (1 mg total) by mouth at bedtime.   nicotine 21 mg/24hr patch Commonly known as:  NICODERM CQ - dosed in mg/24 hours Place 1 patch (21 mg total) onto the skin daily. Start taking on:  01/29/2018   OXcarbazepine 150 MG tablet Commonly known as:  TRILEPTAL Take 1 tablet (150 mg total) by mouth 2 (two) times daily.   ranitidine 150 MG tablet Commonly known as:  ZANTAC Take 150 mg by mouth 2 (two) times daily.   Vitamin D (Ergocalciferol) 50000 units Caps capsule Commonly known as:  DRISDOL Take 50,000 Units by mouth every 7 (seven) days.       Allergies  Allergen Reactions  . Amaranth (Fd&C Red #2) Hypertension  . Metrizamide Hypertension    IV DYE  . Ativan [Lorazepam]   . Betadine [Povidone Iodine] Itching    "Makes the inside of my mouth itch, makes me itch internally, and it makes me want to scratch myself raw "  . Latex Itching  . Penicillins     Has patient had a PCN reaction causing immediate rash, facial/tongue/throat swelling, SOB or lightheadedness with hypotension: Yes Has patient had a PCN reaction causing severe rash involving mucus membranes or skin necrosis: Unknown Has patient had a PCN reaction that required hospitalization: Unknown Has patient had a PCN reaction occurring within the last 10 years:No If all of the above answers are "NO", then may proceed with Cephalosporin use.  **Childhood alergy    Consultations:  Psychiatry   Procedures/Studies: Ct Head Wo Contrast  Result Date: 01/25/2018 CLINICAL DATA:  57 year old female with progressive unexplained altered mental status, confusion and paranoia for 2 days. Hallucinations  today. Fall while being transported to the ED. EXAM: CT HEAD WITHOUT CONTRAST CT CERVICAL SPINE WITHOUT CONTRAST TECHNIQUE: Multidetector CT imaging of the head and cervical spine was performed following the standard protocol without intravenous contrast. Multiplanar CT image reconstructions of the cervical spine were also generated. COMPARISON:  Prior thyroid ultrasounds FINDINGS: CT HEAD FINDINGS Brain: No midline shift, ventriculomegaly, mass effect, evidence of mass lesion, intracranial hemorrhage or evidence of cortically based acute infarction. Gray-white matter differentiation is within normal limits throughout the brain. No encephalomalacia identified. Vascular: Mild Calcified atherosclerosis at the skull base. No suspicious intracranial vascular hyperdensity. Skull: No acute osseous abnormality identified. Sinuses/Orbits: Clear. Other: Visualized orbit soft tissues are within normal limits. No acute scalp soft tissue finding. Mild left forehead scalp scarring. CT CERVICAL SPINE FINDINGS Alignment: Straightening of cervical lordosis. Bilateral posterior element alignment is within normal limits. Cervicothoracic junction alignment is within normal limits. Skull base and vertebrae: Osteopenia. Visualized skull base is intact. No atlanto-occipital dissociation. Congenital incomplete ossification of the posterior C1 ring. No cervical spine fracture identified. Soft tissues and spinal canal: No prevertebral fluid or swelling. No visible canal hematoma. Disc levels:  Normal for age.  Upper chest: T1 spina bifida occulta (normal variant). Visible upper thoracic levels appear intact. Apical lung scarring greater on the right. Chronic right thyroid lower pole nodule. IMPRESSION: 1.  Normal noncontrast CT appearance of the brain. 2. Negative for age CT appearance of the cervical spine. 3. Chronic thyroid goiter. Electronically Signed   By: Genevie Ann M.D.   On: 01/25/2018 20:19   Ct Cervical Spine Wo Contrast  Result  Date: 01/25/2018 CLINICAL DATA:  57 year old female with progressive unexplained altered mental status, confusion and paranoia for 2 days. Hallucinations today. Fall while being transported to the ED. EXAM: CT HEAD WITHOUT CONTRAST CT CERVICAL SPINE WITHOUT CONTRAST TECHNIQUE: Multidetector CT imaging of the head and cervical spine was performed following the standard protocol without intravenous contrast. Multiplanar CT image reconstructions of the cervical spine were also generated. COMPARISON:  Prior thyroid ultrasounds FINDINGS: CT HEAD FINDINGS Brain: No midline shift, ventriculomegaly, mass effect, evidence of mass lesion, intracranial hemorrhage or evidence of cortically based acute infarction. Gray-white matter differentiation is within normal limits throughout the brain. No encephalomalacia identified. Vascular: Mild Calcified atherosclerosis at the skull base. No suspicious intracranial vascular hyperdensity. Skull: No acute osseous abnormality identified. Sinuses/Orbits: Clear. Other: Visualized orbit soft tissues are within normal limits. No acute scalp soft tissue finding. Mild left forehead scalp scarring. CT CERVICAL SPINE FINDINGS Alignment: Straightening of cervical lordosis. Bilateral posterior element alignment is within normal limits. Cervicothoracic junction alignment is within normal limits. Skull base and vertebrae: Osteopenia. Visualized skull base is intact. No atlanto-occipital dissociation. Congenital incomplete ossification of the posterior C1 ring. No cervical spine fracture identified. Soft tissues and spinal canal: No prevertebral fluid or swelling. No visible canal hematoma. Disc levels:  Normal for age. Upper chest: T1 spina bifida occulta (normal variant). Visible upper thoracic levels appear intact. Apical lung scarring greater on the right. Chronic right thyroid lower pole nodule. IMPRESSION: 1.  Normal noncontrast CT appearance of the brain. 2. Negative for age CT appearance of  the cervical spine. 3. Chronic thyroid goiter. Electronically Signed   By: Genevie Ann M.D.   On: 01/25/2018 20:19   Dg Chest Port 1 View  Result Date: 01/25/2018 CLINICAL DATA:  Admission chest radiograph. Acute onset of encephalopathy. EXAM: PORTABLE CHEST 1 VIEW COMPARISON:  Chest radiograph performed 04/17/2017 FINDINGS: The lungs are well-aerated and clear. There is no evidence of focal opacification, pleural effusion or pneumothorax. The cardiomediastinal silhouette is within normal limits. No acute osseous abnormalities are seen. IMPRESSION: No acute cardiopulmonary process seen. Electronically Signed   By: Garald Balding M.D.   On: 01/25/2018 22:44    (Echo, Carotid, EGD, Colonoscopy, ERCP)    Subjective: No complaints feels great wants to go home.  Discharge Exam: Vitals:   01/27/18 2047 01/28/18 0611  BP: (!) 148/79 (!) 151/84  Pulse: 92 86  Resp: 18 16  Temp: 98 F (36.7 C) 98 F (36.7 C)  SpO2: 98% 97%   Vitals:   01/27/18 0552 01/27/18 1358 01/27/18 2047 01/28/18 0611  BP: 127/80 139/65 (!) 148/79 (!) 151/84  Pulse: 85 91 92 86  Resp: 20 20 18 16   Temp: 98.1 F (36.7 C) 98 F (36.7 C) 98 F (36.7 C) 98 F (36.7 C)  TempSrc: Oral Axillary Oral Oral  SpO2: 96% 97% 98% 97%  Weight:      Height:        General: Pt is alert, awake, not in acute distress Cardiovascular: RRR, S1/S2 +, no rubs, no gallops Respiratory: CTA  bilaterally, no wheezing, no rhonchi Abdominal: Soft, NT, ND, bowel sounds + Extremities: no edema, no cyanosis    The results of significant diagnostics from this hospitalization (including imaging, microbiology, ancillary and laboratory) are listed below for reference.     Microbiology: Recent Results (from the past 240 hour(s))  Culture, blood (routine x 2)     Status: None (Preliminary result)   Collection Time: 01/25/18 10:33 PM  Result Value Ref Range Status   Specimen Description   Final    BLOOD LEFT HAND Performed at [facility] [address], [place], [place] [address]    Special Requests   Final    IN PEDIATRIC BOTTLE Blood Culture adequate volume Performed at [facility] [address], [place], [place] [address]    Culture   Final    NO GROWTH 1 DAY Performed at [facility], [place] [address], [place], [place] [address]    Report Status PENDING  Incomplete  Culture, blood (routine x 2)     Status: None (Preliminary result)   Collection Time: 01/25/18 10:33 PM  Result Value Ref Range Status   Specimen Description   Final    BLOOD RIGHT ANTECUBITAL Performed at [facility] [address], [place], [place] [address]    Special Requests   Final    BOTTLES DRAWN AEROBIC AND ANAEROBIC Blood Culture adequate volume Performed at [facility] [address], [place], [place] [address]    Culture   Final    NO GROWTH 1 DAY Performed at [facility], [place] [address] [place], [place] [address]    Report Status PENDING  Incomplete     Labs: BNP (last 3 results) No results for input(s): BNP in the last 8760 hours. Basic Metabolic Panel: Recent Labs  Lab 01/25/18 1914 01/26/18 0525 01/27/18 1011  NA 139 141 144  K 3.0* 4.3 3.6  CL 103 109 114*  CO2 22 22 23   GLUCOSE 118* 122* 112*  BUN 11 8 12   CREATININE 0.71 0.73 0.73  CALCIUM 9.7 8.4* 8.6*  MG  --  1.9 2.1   Liver Function Tests: Recent Labs  Lab 01/25/18 1914 01/26/18 0525  AST 27 21  ALT 34 26  ALKPHOS 145* 109  BILITOT 0.5 0.1*  PROT 7.9 6.2*  ALBUMIN 4.3 3.3*   No results for input(s): LIPASE, AMYLASE in the last 168 hours. Recent Labs  Lab 01/25/18 1915  AMMONIA 34   CBC: Recent Labs  Lab 01/25/18 1914 01/26/18 0525  WBC 17.5* 12.8*  NEUTROABS 9.3*  --   HGB 17.7* 15.5*  HCT 50.8* 46.3*  MCV 89.8 91.0  PLT 382 345   Cardiac Enzymes: No results for input(s): CKTOTAL, CKMB, CKMBINDEX, TROPONINI in the last 168  hours. BNP: Invalid input(s): POCBNP CBG: Recent Labs  Lab 01/26/18 1909  GLUCAP 129*   D-Dimer No results for input(s): DDIMER in the last 72 hours. Hgb A1c No results for input(s): HGBA1C in the last 72 hours. Lipid Profile No results for input(s): CHOL, HDL, LDLCALC, TRIG, CHOLHDL, LDLDIRECT in the last 72 hours. Thyroid function studies Recent Labs    01/25/18 1924  TSH 2.811   Anemia work up No results for input(s): VITAMINB12, FOLATE, FERRITIN, TIBC, IRON, RETICCTPCT in the last 72 hours. Urinalysis    Component Value Date/Time   COLORURINE YELLOW 01/25/2018 1915   APPEARANCEUR CLEAR 01/25/2018 1915   LABSPEC 1.006 01/25/2018 1915  PHURINE 6.0 01/25/2018 1915   GLUCOSEU NEGATIVE 01/25/2018 1915   HGBUR NEGATIVE 01/25/2018 [place] NEGATIVE 01/25/2018 [place] 01/25/2018 [place] NEGATIVE 01/25/2018 1915   NITRITE NEGATIVE 01/25/2018 1915   LEUKOCYTESUR NEGATIVE 01/25/2018 1915   Sepsis Labs Invalid input(s): PROCALCITONIN,  WBC,  LACTICIDVEN Microbiology Recent Results (from the past 240 hour(s))  Culture, blood (routine x 2)     Status: None (Preliminary result)   Collection Time: 01/25/18 10:33 PM  Result Value Ref Range Status   Specimen Description   Final    BLOOD LEFT HAND Performed at [facility], [place] [address], [place], [place] [address]    Special Requests   Final    IN PEDIATRIC BOTTLE Blood Culture adequate volume Performed at [facility] [address] [place], [place] [address]    Culture   Final    NO GROWTH 1 DAY Performed at [facility], [place] [address] [place], [place] [address]    Report Status PENDING  Incomplete  Culture, blood (routine x 2)     Status: None (Preliminary result)   Collection Time: 01/25/18 10:33 PM  Result Value Ref Range Status   Specimen Description   Final    BLOOD RIGHT ANTECUBITAL Performed at [facility] [address] [place], [place] [address]    Special Requests   Final    BOTTLES DRAWN AEROBIC AND ANAEROBIC Blood Culture adequate volume Performed at [facility] [address], [place], [place] [address]    Culture   Final    NO GROWTH 1 DAY Performed at [facility], [place] [address] [place],  [address]    Report Status PENDING  Incomplete     Time coordinating discharge: Over 30 minutes  SIGNED:   Charlynne Cousins, MD  [facility] 01/28/2018, 10:40 AM Pager   If 7PM-7AM, please contact night-coverage [email or web address] Password [email or web address]

## 2018-01-31 LAB — CULTURE, BLOOD (ROUTINE X 2)
CULTURE: NO GROWTH
Culture: NO GROWTH
Special Requests: ADEQUATE
Special Requests: ADEQUATE

## 2018-02-05 ENCOUNTER — Other Ambulatory Visit: Payer: Self-pay

## 2018-02-05 ENCOUNTER — Emergency Department (HOSPITAL_COMMUNITY)
Admission: EM | Admit: 2018-02-05 | Discharge: 2018-02-06 | Disposition: A | Discharge status: Other Acute Inpatient Hospital | Payer: Medicare Other | Attending: Emergency Medicine | Admitting: Emergency Medicine

## 2018-02-05 ENCOUNTER — Encounter (HOSPITAL_COMMUNITY): Payer: Self-pay

## 2018-02-05 DIAGNOSIS — R443 Hallucinations, unspecified: Secondary | ICD-10-CM | POA: Diagnosis not present

## 2018-02-05 DIAGNOSIS — F333 Major depressive disorder, recurrent, severe with psychotic symptoms: Secondary | ICD-10-CM | POA: Diagnosis not present

## 2018-02-05 DIAGNOSIS — Z79899 Other long term (current) drug therapy: Secondary | ICD-10-CM | POA: Diagnosis not present

## 2018-02-05 DIAGNOSIS — F17210 Nicotine dependence, cigarettes, uncomplicated: Secondary | ICD-10-CM | POA: Diagnosis not present

## 2018-02-05 DIAGNOSIS — Z008 Encounter for other general examination: Secondary | ICD-10-CM | POA: Diagnosis not present

## 2018-02-05 DIAGNOSIS — R4182 Altered mental status, unspecified: Secondary | ICD-10-CM | POA: Diagnosis present

## 2018-02-05 DIAGNOSIS — Z91040 Latex allergy status: Secondary | ICD-10-CM | POA: Insufficient documentation

## 2018-02-05 LAB — RAPID URINE DRUG SCREEN, HOSP PERFORMED
AMPHETAMINES: NOT DETECTED
BENZODIAZEPINES: POSITIVE — AB
Barbiturates: NOT DETECTED
COCAINE: NOT DETECTED
OPIATES: NOT DETECTED
Tetrahydrocannabinol: NOT DETECTED

## 2018-02-05 LAB — COMPREHENSIVE METABOLIC PANEL
ALT: 31 U/L (ref 14–54)
ANION GAP: 13 (ref 5–15)
AST: 33 U/L (ref 15–41)
Albumin: 4 g/dL (ref 3.5–5.0)
Alkaline Phosphatase: 150 U/L — ABNORMAL HIGH (ref 38–126)
BUN: 5 mg/dL — ABNORMAL LOW (ref 6–20)
CHLORIDE: 102 mmol/L (ref 101–111)
CO2: 19 mmol/L — ABNORMAL LOW (ref 22–32)
CREATININE: 0.65 mg/dL (ref 0.44–1.00)
Calcium: 9.4 mg/dL (ref 8.9–10.3)
Glucose, Bld: 132 mg/dL — ABNORMAL HIGH (ref 65–99)
POTASSIUM: 3.6 mmol/L (ref 3.5–5.1)
SODIUM: 134 mmol/L — AB (ref 135–145)
Total Bilirubin: 0.6 mg/dL (ref 0.3–1.2)
Total Protein: 8 g/dL (ref 6.5–8.1)

## 2018-02-05 LAB — URINALYSIS, ROUTINE W REFLEX MICROSCOPIC
BILIRUBIN URINE: NEGATIVE
Glucose, UA: NEGATIVE mg/dL
HGB URINE DIPSTICK: NEGATIVE
Ketones, ur: NEGATIVE mg/dL
Leukocytes, UA: NEGATIVE
Nitrite: NEGATIVE
PH: 5 (ref 5.0–8.0)
Protein, ur: NEGATIVE mg/dL
SPECIFIC GRAVITY, URINE: 1.004 — AB (ref 1.005–1.030)

## 2018-02-05 LAB — CBC WITH DIFFERENTIAL/PLATELET
Basophils Absolute: 0.1 10*3/uL (ref 0.0–0.1)
Basophils Relative: 0 %
EOS ABS: 0.2 10*3/uL (ref 0.0–0.7)
Eosinophils Relative: 1 %
HCT: 53.4 % — ABNORMAL HIGH (ref 36.0–46.0)
HEMOGLOBIN: 18.3 g/dL — AB (ref 12.0–15.0)
LYMPHS ABS: 5 10*3/uL — AB (ref 0.7–4.0)
Lymphocytes Relative: 32 %
MCH: 31 pg (ref 26.0–34.0)
MCHC: 34.3 g/dL (ref 30.0–36.0)
MCV: 90.5 fL (ref 78.0–100.0)
MONO ABS: 0.8 10*3/uL (ref 0.1–1.0)
MONOS PCT: 5 %
NEUTROS PCT: 62 %
Neutro Abs: 9.8 10*3/uL — ABNORMAL HIGH (ref 1.7–7.7)
Platelets: 377 10*3/uL (ref 150–400)
RBC: 5.9 MIL/uL — ABNORMAL HIGH (ref 3.87–5.11)
RDW: 14.2 % (ref 11.5–15.5)
WBC: 15.9 10*3/uL — ABNORMAL HIGH (ref 4.0–10.5)

## 2018-02-05 LAB — ETHANOL

## 2018-02-05 MED ORDER — DULOXETINE HCL 60 MG PO CPEP
60.0000 mg | ORAL_CAPSULE | Freq: Every day | ORAL | Status: DC
  Filled 2018-02-05: qty 1

## 2018-02-05 MED ORDER — FAMOTIDINE 20 MG PO TABS: 10.0000 mg | ORAL_TABLET | Freq: Every day | ORAL | Status: DC

## 2018-02-05 MED ORDER — MELATONIN 1 MG PO TABS
3.0000 mg | ORAL_TABLET | Freq: Every day | ORAL | Status: DC
  Administered 2018-02-06: 3 mg via ORAL
  Filled 2018-02-05: qty 3

## 2018-02-05 MED ORDER — ALBUTEROL SULFATE HFA 108 (90 BASE) MCG/ACT IN AERS: 2.0000 | INHALATION_SPRAY | Freq: Four times a day (QID) | RESPIRATORY_TRACT | Status: DC | PRN

## 2018-02-05 MED ORDER — NICOTINE 21 MG/24HR TD PT24: 21.0000 mg | MEDICATED_PATCH | Freq: Every day | TRANSDERMAL | Status: DC

## 2018-02-05 MED ORDER — LORAZEPAM 1 MG PO TABS
0.5000 mg | ORAL_TABLET | Freq: Three times a day (TID) | ORAL | Status: DC
  Administered 2018-02-06 (×2): 0.5 mg via ORAL
  Filled 2018-02-05 (×2): qty 1

## 2018-02-05 MED ORDER — GABAPENTIN 400 MG PO CAPS
800.0000 mg | ORAL_CAPSULE | Freq: Three times a day (TID) | ORAL | Status: DC
  Administered 2018-02-06: 800 mg via ORAL
  Filled 2018-02-05: qty 2

## 2018-02-05 MED ORDER — VITAMIN D (ERGOCALCIFEROL) 1.25 MG (50000 UNIT) PO CAPS: 50000.0000 [IU] | ORAL_CAPSULE | ORAL | Status: DC

## 2018-02-05 MED ORDER — OXCARBAZEPINE 150 MG PO TABS
150.0000 mg | ORAL_TABLET | Freq: Two times a day (BID) | ORAL | Status: DC
  Administered 2018-02-06: 150 mg via ORAL
  Filled 2018-02-05 (×2): qty 1

## 2018-02-05 NOTE — ED Notes (Signed)
Ordered food

## 2018-02-05 NOTE — ED Notes (Signed)
TTS on process now.

## 2018-02-05 NOTE — ED Provider Notes (Signed)
[facility] EMERGENCY DEPARTMENT Provider Note   CSN: [id number] Arrival date & time: 02/05/18  1403     History   Chief Complaint Chief Complaint  Patient presents with  . Altered Mental Status    HPI Angela Kane is a 57 y.o. female.  HPI history is obtained from patient and from patient's daughter.  Patient has felt "foggy in my head for the past 2 weeks,, when she was hospitalized for delirium and subsequently released.  Her psychiatric medications were adjusted.  She reports seeing monsters earlier today.  Her daughter reports that she was talking to a cat which was not there.  Patient denies other complaint.  She is not having hallucinations presently.  Denies pain anywhere.  Denies wanting to harm herself or others. Past Medical History:  Diagnosis Date  . History of degenerative disc disease     Patient Active Problem List   Diagnosis Date Noted  . Acute urinary retention 01/26/2018  . Leukocytosis 01/26/2018  . Prolonged Q-T interval on ECG 01/26/2018  . Tobacco abuse 01/26/2018  . GERD (gastroesophageal reflux disease) 01/26/2018  . Psychosis ([place]) 01/26/2018  . Altered mental status   . Toxic encephalopathy 01/25/2018    Past Surgical History:  Procedure Laterality Date  . ABDOMINAL HYSTERECTOMY    . CHOLECYSTECTOMY      OB History    No data available       Home Medications    Prior to Admission medications   Medication Sig Start Date End Date Taking? Authorizing Provider  albuterol (PROVENTIL HFA;VENTOLIN HFA) 108 (90 Base) MCG/ACT inhaler Inhale 2 puffs into the lungs every 6 (six) hours as needed for wheezing or shortness of breath.    [provider]  diclofenac sodium (VOLTAREN) 1 % GEL Apply 2 g topically as needed.     [provider]  DULoxetine 40 MG CPEP Take 40 mg by mouth daily. 01/29/18   Charlynne Cousins, MD  gabapentin (NEURONTIN) 400 MG capsule Take 2 capsules (800 mg total) by mouth 3  (three) times daily. 01/28/18   Charlynne Cousins, MD  LORazepam (ATIVAN) 0.5 MG tablet Take 0.5 mg by mouth every 8 (eight) hours.    [provider]  Melatonin 1 MG TABS Take 1 tablet (1 mg total) by mouth at bedtime. 01/28/18   Charlynne Cousins, MD  nicotine (NICODERM CQ - DOSED IN MG/24 HOURS) 21 mg/24hr patch Place 1 patch (21 mg total) onto the skin daily. 01/29/18   Charlynne Cousins, MD  OXcarbazepine (TRILEPTAL) 150 MG tablet Take 1 tablet (150 mg total) by mouth 2 (two) times daily. 01/28/18   Charlynne Cousins, MD  ranitidine (ZANTAC) 150 MG tablet Take 150 mg by mouth 2 (two) times daily.    [provider]  Vitamin D, Ergocalciferol, (DRISDOL) 50000 units CAPS capsule Take 50,000 Units by mouth every 7 (seven) days.    [provider]    Family History Family History  Problem Relation Age of Onset  . Heart disease Mother   . Diabetes Mother   . Diabetes Father   . Heart disease Father     Social History Social History   Tobacco Use  . Smoking status: Current Every Day Smoker    Packs/day: 2.00  . Smokeless tobacco: Never Used  Substance Use Topics  . Alcohol use: No    Frequency: Never  . Drug use: No     Allergies   Amaranth (fd&c red #  2); Metrizamide; Ativan [lorazepam]; Betadine [povidone iodine]; Latex; and Penicillins   Review of Systems Review of Systems  Psychiatric/Behavioral: Positive for hallucinations.  All other systems reviewed and are negative.    Physical Exam Updated Vital Signs BP 131/78   Pulse (!) 108   Temp 98.1 F (36.7 C) (Oral)   Resp 18   Wt 63.5 kg (140 lb)   SpO2 96%   BMI 25.61 kg/m   Physical Exam  Constitutional: She is oriented to person, place, and time. No distress.  Disheveled alert Glasgow Coma Score 15  HENT:  Head: Normocephalic and atraumatic.  Eyes: Conjunctivae are normal. Pupils are equal, round, and reactive to light.  Neck: Neck supple. No tracheal deviation present.  No thyromegaly present.  Cardiovascular: Normal rate and regular rhythm.  No murmur heard. Pulmonary/Chest: Effort normal and breath sounds normal.  Abdominal: Soft. Bowel sounds are normal. She exhibits no distension. There is no tenderness.  Musculoskeletal: Normal range of motion. She exhibits no edema or tenderness.  Neurological: She is alert and oriented to person, place, and time. Coordination normal.  Normal Romberg normal pronator drift normal DTR symmetric bilaterally at knee jerk ankle jerk and biceps toes downward going bilaterally not lightheaded on standing.  Cranial nerves II through XII grossly intact.  No asterixis  Skin: Skin is warm and dry. No rash noted.  Psychiatric: She has a normal mood and affect.  Nursing note and vitals reviewed.    ED Treatments / Results  Labs (all labs ordered are listed, but only abnormal results are displayed) Labs Reviewed  COMPREHENSIVE METABOLIC PANEL  ETHANOL  RAPID URINE DRUG SCREEN, HOSP PERFORMED  CBC WITH DIFFERENTIAL/PLATELET  URINALYSIS, ROUTINE W REFLEX MICROSCOPIC  I-STAT BETA HCG BLOOD, ED ([facility], [facility], [facility] ONLY)    EKG  EKG Interpretation None       Radiology No results found.  Procedures Procedures (including critical care time)  Medications Ordered in ED Medications - No data to display   Initial Impression / Assessment and Plan / ED Course  I have reviewed the triage vital signs and the nursing notes.  Pertinent labs & imaging results that were available during my care of the patient were reviewed by me and considered in my medical decision making (see chart for details).     Patient is medically cleared for psychiatric evaluation  Final Clinical Impressions(s) / ED Diagnoses  Dx hallucinations Final diagnoses:  None    ED Discharge Orders    None       Orlie Dakin, MD 02/05/18 2351

## 2018-02-05 NOTE — ED Triage Notes (Signed)
Pt presents to the ed with complaints of altered mental status. Per daughter the patient had an episode like this two weeks ago and was seen at [facility] long and diagnosed with delirium.  The patient has not been acting normal since. Patient presents confused. Talking about her cat repeatedly that she does not have. Pt is obviously weak.

## 2018-02-06 DIAGNOSIS — F333 Major depressive disorder, recurrent, severe with psychotic symptoms: Secondary | ICD-10-CM | POA: Diagnosis not present
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
## 2018-02-06 NOTE — ED Notes (Signed)
[facility] unable to transfer pt until 7:30 am. [facility] notified. Report given already.

## 2018-02-06 NOTE — BH Assessment (Addendum)
  Inpatient Placement Search  [facility] ED Referral sent to the following facilities: [facility]; [facility]; [facility]; [facility]; [facility]; [place] Lynnie Koehler, MS, LPCA, [facility] Therapeutic Triage Specialist  [phone]

## 2018-02-06 NOTE — Progress Notes (Signed)
Angela Kane from [facility] called to report the pt has been accepted to their facility for inpt treatment. Accepting is Dr. Elaina Hoops, MD, call to report [phone]. Pt accepted to the [facility]. Pt's nurse Garlon Hatchet, RN advised of the acceptance.   Lind Covert, MSW, LCSW Therapeutic Triage Specialist  [phone]

## 2018-02-15 DIAGNOSIS — Z751 Person awaiting admission to adequate facility elsewhere: Secondary | ICD-10-CM | POA: Diagnosis not present

## 2018-02-15 DIAGNOSIS — J449 Chronic obstructive pulmonary disease, unspecified: Secondary | ICD-10-CM | POA: Diagnosis not present

## 2018-02-15 DIAGNOSIS — R0602 Shortness of breath: Secondary | ICD-10-CM | POA: Diagnosis not present

## 2018-02-15 DIAGNOSIS — R443 Hallucinations, unspecified: Secondary | ICD-10-CM | POA: Diagnosis not present

## 2018-02-15 DIAGNOSIS — E119 Type 2 diabetes mellitus without complications: Secondary | ICD-10-CM | POA: Diagnosis not present

## 2018-02-15 DIAGNOSIS — R45851 Suicidal ideations: Secondary | ICD-10-CM | POA: Diagnosis not present

## 2018-02-15 DIAGNOSIS — F333 Major depressive disorder, recurrent, severe with psychotic symptoms: Secondary | ICD-10-CM | POA: Diagnosis not present

## 2018-02-15 DIAGNOSIS — Z79899 Other long term (current) drug therapy: Secondary | ICD-10-CM | POA: Diagnosis not present

## 2018-02-16 DIAGNOSIS — Z751 Person awaiting admission to adequate facility elsewhere: Secondary | ICD-10-CM | POA: Diagnosis not present

## 2018-02-16 DIAGNOSIS — R443 Hallucinations, unspecified: Secondary | ICD-10-CM | POA: Diagnosis not present

## 2018-02-16 DIAGNOSIS — E119 Type 2 diabetes mellitus without complications: Secondary | ICD-10-CM | POA: Diagnosis present

## 2018-02-16 DIAGNOSIS — Z79899 Other long term (current) drug therapy: Secondary | ICD-10-CM | POA: Diagnosis not present

## 2018-02-16 DIAGNOSIS — F333 Major depressive disorder, recurrent, severe with psychotic symptoms: Secondary | ICD-10-CM | POA: Diagnosis present

## 2018-02-16 DIAGNOSIS — K589 Irritable bowel syndrome without diarrhea: Secondary | ICD-10-CM | POA: Diagnosis present

## 2018-02-16 DIAGNOSIS — R45851 Suicidal ideations: Secondary | ICD-10-CM | POA: Diagnosis not present

## 2018-02-16 DIAGNOSIS — G8929 Other chronic pain: Secondary | ICD-10-CM | POA: Diagnosis present

## 2018-02-16 DIAGNOSIS — J449 Chronic obstructive pulmonary disease, unspecified: Secondary | ICD-10-CM | POA: Diagnosis not present

## 2018-02-16 DIAGNOSIS — F4310 Post-traumatic stress disorder, unspecified: Secondary | ICD-10-CM | POA: Diagnosis present

## 2018-02-16 DIAGNOSIS — K219 Gastro-esophageal reflux disease without esophagitis: Secondary | ICD-10-CM | POA: Diagnosis present

## 2018-02-16 DIAGNOSIS — F17210 Nicotine dependence, cigarettes, uncomplicated: Secondary | ICD-10-CM | POA: Diagnosis present

## 2018-02-16 DIAGNOSIS — M549 Dorsalgia, unspecified: Secondary | ICD-10-CM | POA: Diagnosis present

## 2018-02-16 DIAGNOSIS — M792 Neuralgia and neuritis, unspecified: Secondary | ICD-10-CM | POA: Diagnosis present

## 2018-03-02 DIAGNOSIS — E0789 Other specified disorders of thyroid: Secondary | ICD-10-CM | POA: Diagnosis not present

## 2018-03-02 DIAGNOSIS — R45851 Suicidal ideations: Secondary | ICD-10-CM | POA: Diagnosis not present

## 2018-03-02 DIAGNOSIS — F419 Anxiety disorder, unspecified: Secondary | ICD-10-CM | POA: Diagnosis not present

## 2018-03-02 DIAGNOSIS — E119 Type 2 diabetes mellitus without complications: Secondary | ICD-10-CM | POA: Diagnosis not present

## 2018-03-02 DIAGNOSIS — Z87898 Personal history of other specified conditions: Secondary | ICD-10-CM | POA: Diagnosis not present

## 2018-03-02 DIAGNOSIS — E7800 Pure hypercholesterolemia, unspecified: Secondary | ICD-10-CM | POA: Diagnosis not present

## 2018-03-02 DIAGNOSIS — E559 Vitamin D deficiency, unspecified: Secondary | ICD-10-CM | POA: Diagnosis not present

## 2018-03-02 DIAGNOSIS — K219 Gastro-esophageal reflux disease without esophagitis: Secondary | ICD-10-CM | POA: Diagnosis not present

## 2018-03-02 DIAGNOSIS — E1169 Type 2 diabetes mellitus with other specified complication: Secondary | ICD-10-CM | POA: Diagnosis not present

## 2018-03-02 DIAGNOSIS — F329 Major depressive disorder, single episode, unspecified: Secondary | ICD-10-CM | POA: Diagnosis not present

## 2018-03-02 DIAGNOSIS — E785 Hyperlipidemia, unspecified: Secondary | ICD-10-CM | POA: Diagnosis not present

## 2018-03-02 DIAGNOSIS — J449 Chronic obstructive pulmonary disease, unspecified: Secondary | ICD-10-CM | POA: Diagnosis not present

## 2018-03-02 DIAGNOSIS — F17200 Nicotine dependence, unspecified, uncomplicated: Secondary | ICD-10-CM | POA: Diagnosis not present

## 2018-03-05 DIAGNOSIS — J449 Chronic obstructive pulmonary disease, unspecified: Secondary | ICD-10-CM | POA: Diagnosis not present

## 2018-03-05 DIAGNOSIS — F250 Schizoaffective disorder, bipolar type: Secondary | ICD-10-CM | POA: Diagnosis not present

## 2018-03-05 DIAGNOSIS — I4581 Long QT syndrome: Secondary | ICD-10-CM | POA: Diagnosis not present

## 2018-03-05 DIAGNOSIS — E119 Type 2 diabetes mellitus without complications: Secondary | ICD-10-CM | POA: Diagnosis not present

## 2018-03-08 DIAGNOSIS — F250 Schizoaffective disorder, bipolar type: Secondary | ICD-10-CM | POA: Diagnosis not present

## 2018-03-08 DIAGNOSIS — J449 Chronic obstructive pulmonary disease, unspecified: Secondary | ICD-10-CM | POA: Diagnosis not present

## 2018-03-08 DIAGNOSIS — I4581 Long QT syndrome: Secondary | ICD-10-CM | POA: Diagnosis not present

## 2018-03-09 DIAGNOSIS — F250 Schizoaffective disorder, bipolar type: Secondary | ICD-10-CM | POA: Diagnosis not present

## 2018-03-10 DIAGNOSIS — F250 Schizoaffective disorder, bipolar type: Secondary | ICD-10-CM | POA: Diagnosis not present

## 2018-03-11 DIAGNOSIS — F250 Schizoaffective disorder, bipolar type: Secondary | ICD-10-CM | POA: Diagnosis not present

## 2018-03-12 DIAGNOSIS — F250 Schizoaffective disorder, bipolar type: Secondary | ICD-10-CM | POA: Diagnosis not present

## 2018-03-16 DIAGNOSIS — F333 Major depressive disorder, recurrent, severe with psychotic symptoms: Secondary | ICD-10-CM | POA: Diagnosis not present

## 2018-03-25 DIAGNOSIS — J189 Pneumonia, unspecified organism: Secondary | ICD-10-CM | POA: Diagnosis not present

## 2018-03-25 DIAGNOSIS — D473 Essential (hemorrhagic) thrombocythemia: Secondary | ICD-10-CM | POA: Diagnosis not present

## 2018-03-25 DIAGNOSIS — D509 Iron deficiency anemia, unspecified: Secondary | ICD-10-CM | POA: Diagnosis not present

## 2018-03-25 DIAGNOSIS — R799 Abnormal finding of blood chemistry, unspecified: Secondary | ICD-10-CM | POA: Diagnosis not present

## 2018-03-29 DIAGNOSIS — D72829 Elevated white blood cell count, unspecified: Secondary | ICD-10-CM | POA: Diagnosis not present

## 2018-03-29 DIAGNOSIS — D696 Thrombocytopenia, unspecified: Secondary | ICD-10-CM | POA: Diagnosis not present

## 2018-03-29 DIAGNOSIS — F17210 Nicotine dependence, cigarettes, uncomplicated: Secondary | ICD-10-CM | POA: Diagnosis not present

## 2018-04-13 DIAGNOSIS — F333 Major depressive disorder, recurrent, severe with psychotic symptoms: Secondary | ICD-10-CM | POA: Diagnosis not present

## 2018-05-04 DIAGNOSIS — L570 Actinic keratosis: Secondary | ICD-10-CM | POA: Diagnosis not present

## 2018-05-04 DIAGNOSIS — L578 Other skin changes due to chronic exposure to nonionizing radiation: Secondary | ICD-10-CM | POA: Diagnosis not present

## 2018-05-04 DIAGNOSIS — L820 Inflamed seborrheic keratosis: Secondary | ICD-10-CM | POA: Diagnosis not present

## 2018-05-04 DIAGNOSIS — L821 Other seborrheic keratosis: Secondary | ICD-10-CM | POA: Diagnosis not present

## 2018-05-13 DIAGNOSIS — E1169 Type 2 diabetes mellitus with other specified complication: Secondary | ICD-10-CM | POA: Diagnosis not present

## 2018-06-04 DIAGNOSIS — Z1231 Encounter for screening mammogram for malignant neoplasm of breast: Secondary | ICD-10-CM | POA: Diagnosis not present

## 2018-06-11 DIAGNOSIS — E7800 Pure hypercholesterolemia, unspecified: Secondary | ICD-10-CM | POA: Diagnosis not present

## 2018-06-11 DIAGNOSIS — E1169 Type 2 diabetes mellitus with other specified complication: Secondary | ICD-10-CM | POA: Diagnosis not present

## 2018-06-11 DIAGNOSIS — Z720 Tobacco use: Secondary | ICD-10-CM | POA: Diagnosis not present

## 2018-06-11 DIAGNOSIS — E041 Nontoxic single thyroid nodule: Secondary | ICD-10-CM | POA: Diagnosis not present

## 2018-06-11 DIAGNOSIS — E559 Vitamin D deficiency, unspecified: Secondary | ICD-10-CM | POA: Diagnosis not present

## 2018-06-11 DIAGNOSIS — M1990 Unspecified osteoarthritis, unspecified site: Secondary | ICD-10-CM | POA: Diagnosis not present

## 2018-06-30 DIAGNOSIS — K529 Noninfective gastroenteritis and colitis, unspecified: Secondary | ICD-10-CM | POA: Diagnosis not present

## 2018-06-30 DIAGNOSIS — R197 Diarrhea, unspecified: Secondary | ICD-10-CM | POA: Diagnosis not present

## 2018-07-05 DIAGNOSIS — F333 Major depressive disorder, recurrent, severe with psychotic symptoms: Secondary | ICD-10-CM | POA: Diagnosis not present

## 2018-07-19 DIAGNOSIS — C4442 Squamous cell carcinoma of skin of scalp and neck: Secondary | ICD-10-CM | POA: Diagnosis not present

## 2018-07-19 DIAGNOSIS — L209 Atopic dermatitis, unspecified: Secondary | ICD-10-CM | POA: Diagnosis not present

## 2018-08-09 DIAGNOSIS — F333 Major depressive disorder, recurrent, severe with psychotic symptoms: Secondary | ICD-10-CM | POA: Diagnosis not present

## 2018-09-02 DIAGNOSIS — K644 Residual hemorrhoidal skin tags: Secondary | ICD-10-CM | POA: Diagnosis not present

## 2018-09-02 DIAGNOSIS — K921 Melena: Secondary | ICD-10-CM | POA: Diagnosis not present

## 2018-09-28 DIAGNOSIS — D696 Thrombocytopenia, unspecified: Secondary | ICD-10-CM | POA: Diagnosis not present

## 2018-09-28 DIAGNOSIS — F17210 Nicotine dependence, cigarettes, uncomplicated: Secondary | ICD-10-CM

## 2018-09-28 DIAGNOSIS — D72829 Elevated white blood cell count, unspecified: Secondary | ICD-10-CM

## 2018-09-29 DIAGNOSIS — E1169 Type 2 diabetes mellitus with other specified complication: Secondary | ICD-10-CM | POA: Diagnosis not present

## 2018-09-29 DIAGNOSIS — Z1331 Encounter for screening for depression: Secondary | ICD-10-CM | POA: Diagnosis not present

## 2018-09-29 DIAGNOSIS — E041 Nontoxic single thyroid nodule: Secondary | ICD-10-CM | POA: Diagnosis not present

## 2018-09-29 DIAGNOSIS — E559 Vitamin D deficiency, unspecified: Secondary | ICD-10-CM | POA: Diagnosis not present

## 2018-09-29 DIAGNOSIS — E7800 Pure hypercholesterolemia, unspecified: Secondary | ICD-10-CM | POA: Diagnosis not present

## 2018-09-29 DIAGNOSIS — M1990 Unspecified osteoarthritis, unspecified site: Secondary | ICD-10-CM | POA: Diagnosis not present

## 2018-09-29 DIAGNOSIS — Z1339 Encounter for screening examination for other mental health and behavioral disorders: Secondary | ICD-10-CM | POA: Diagnosis not present

## 2018-09-29 DIAGNOSIS — Z23 Encounter for immunization: Secondary | ICD-10-CM | POA: Diagnosis not present

## 2018-10-11 DIAGNOSIS — F333 Major depressive disorder, recurrent, severe with psychotic symptoms: Secondary | ICD-10-CM | POA: Diagnosis not present

## 2018-10-18 DIAGNOSIS — E041 Nontoxic single thyroid nodule: Secondary | ICD-10-CM | POA: Diagnosis not present

## 2018-10-19 DIAGNOSIS — R197 Diarrhea, unspecified: Secondary | ICD-10-CM | POA: Diagnosis not present

## 2018-10-19 DIAGNOSIS — K921 Melena: Secondary | ICD-10-CM | POA: Diagnosis not present

## 2018-10-29 IMAGING — CT CT CERVICAL SPINE W/O CM
3 of 7 series · 13 of 33 positions shown, 15 images · non-contrast
Comparison: Prior thyroid ultrasounds

CLINICAL DATA: 56-year-old female with progressive unexplained
altered mental status, confusion and paranoia for 2 days.
Hallucinations today. Fall while being transported to the ED.

EXAM:
CT HEAD WITHOUT CONTRAST
CT CERVICAL SPINE WITHOUT CONTRAST
TECHNIQUE: Multidetector CT imaging of the head and cervical spine was
performed following the standard protocol without intravenous
contrast. Multiplanar CT image reconstructions of the cervical spine
were also generated.

[Series 6: coronal soft tissue · coronal · 0.31mm/px · 3 of 61 slices shown]
[im 16/61  bone]
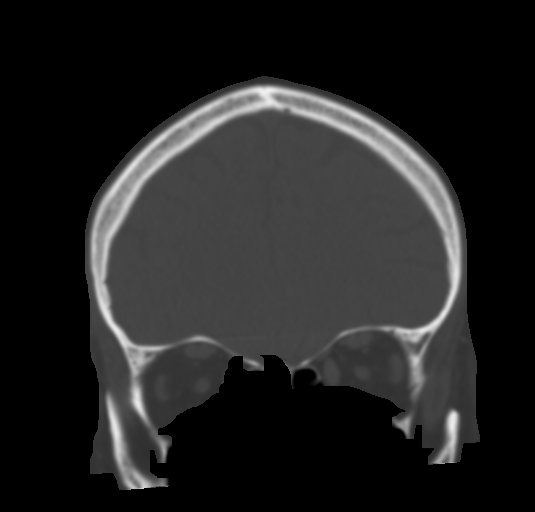
[im 31/61  bone]
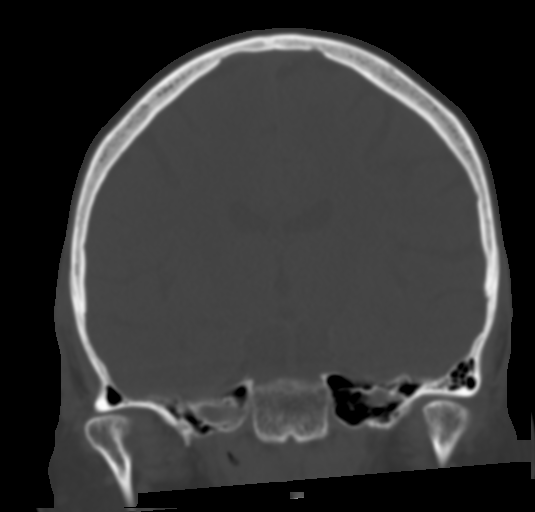
[im 46/61  bone]
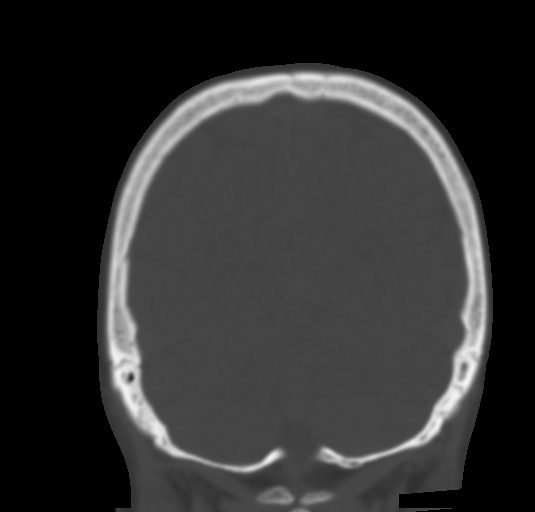

[Series 9: c spine soft · axial · 0.29mm/px · z∈[-299,-179]mm · 5 of 91 slices shown, 7 images]
[im 16/91  soft-tissue]
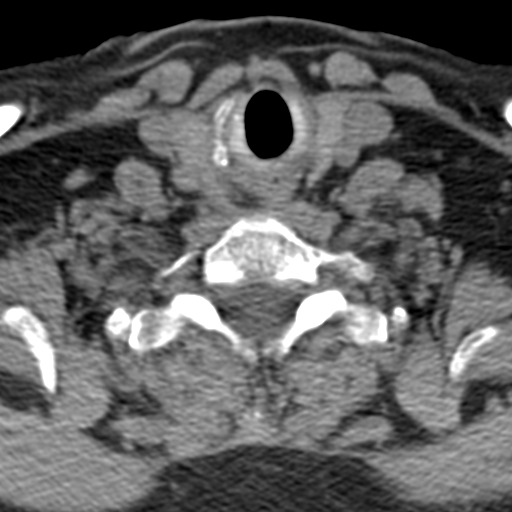
[im 16/91  bone]
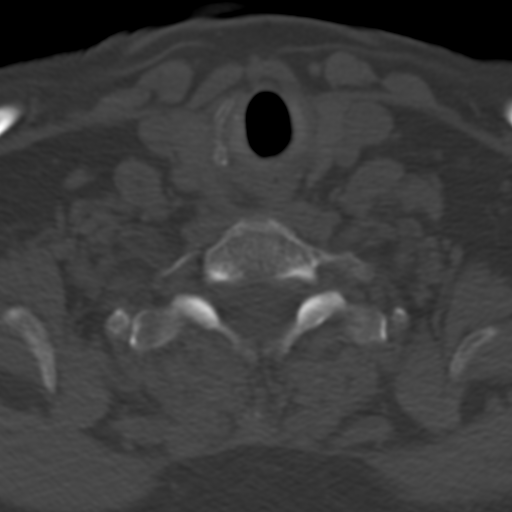
[im 31/91  bone]
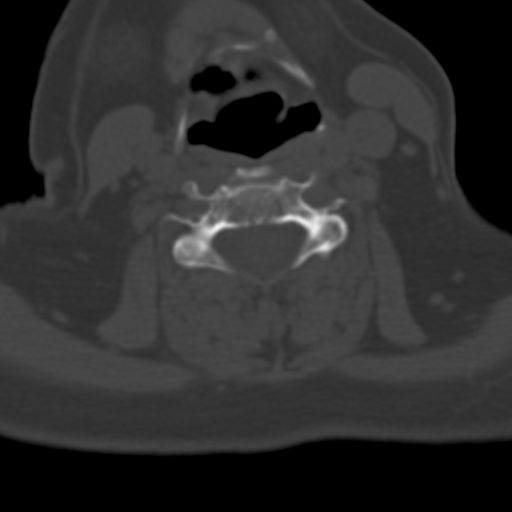
[im 46/91  bone]
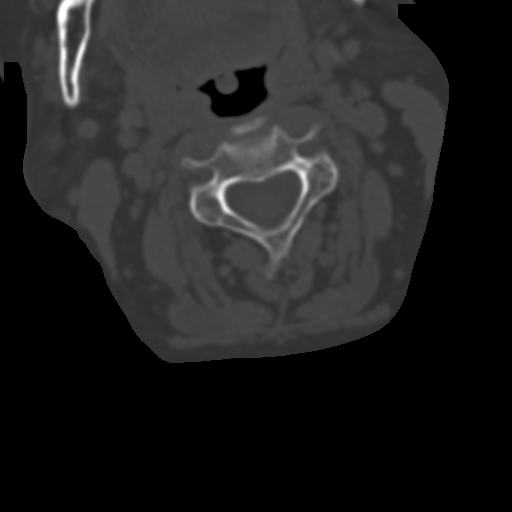
[im 61/91  bone]
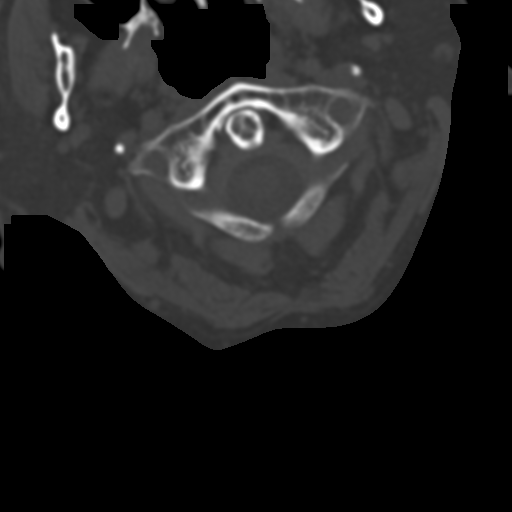
[im 76/91  soft-tissue]
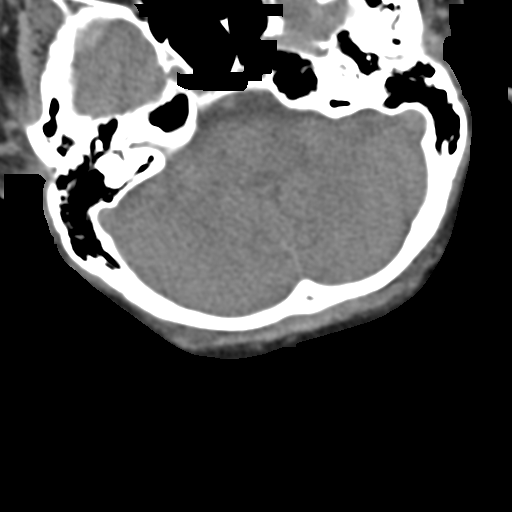
[im 76/91  bone]
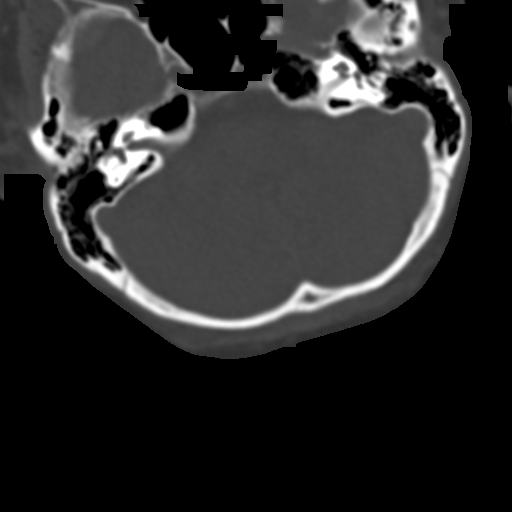

[Series 13: sagittal bone · sagittal · 0.26mm/px · 5 of 69 slices shown]
[im 12/69  bone]
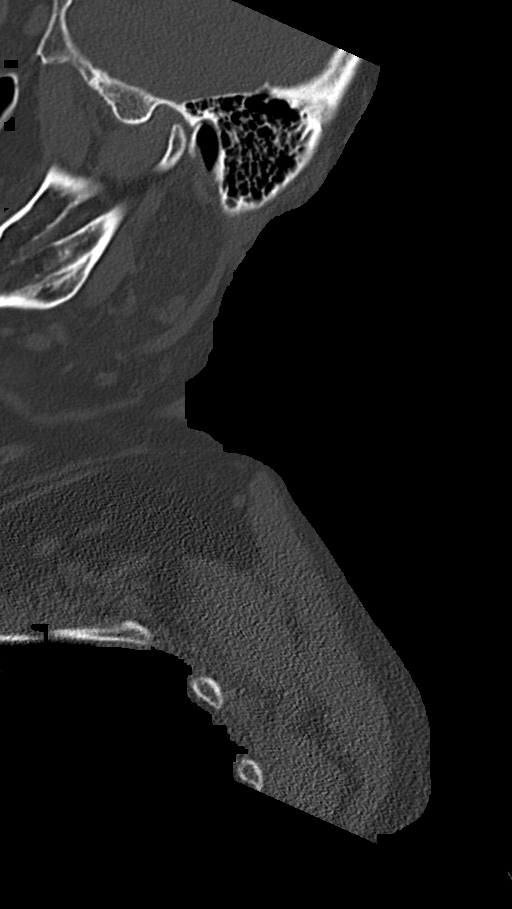
[im 23/69  bone]
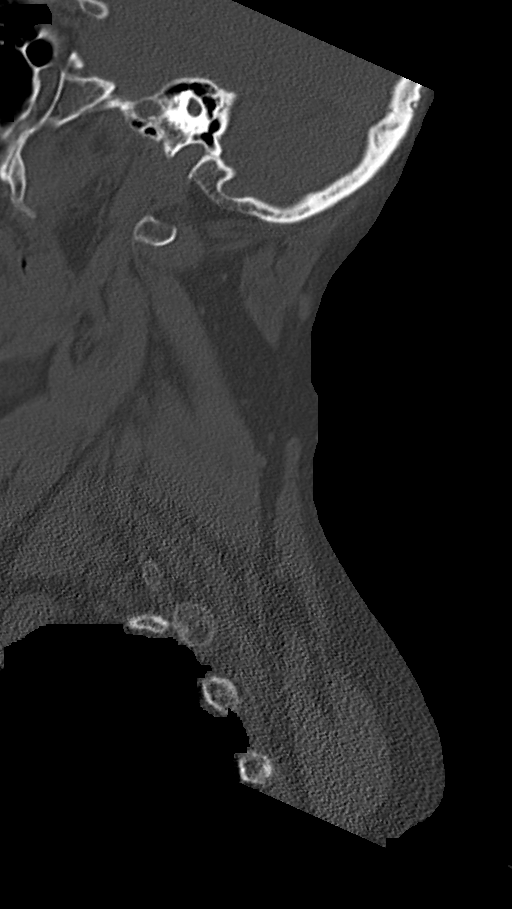
[im 35/69  bone]
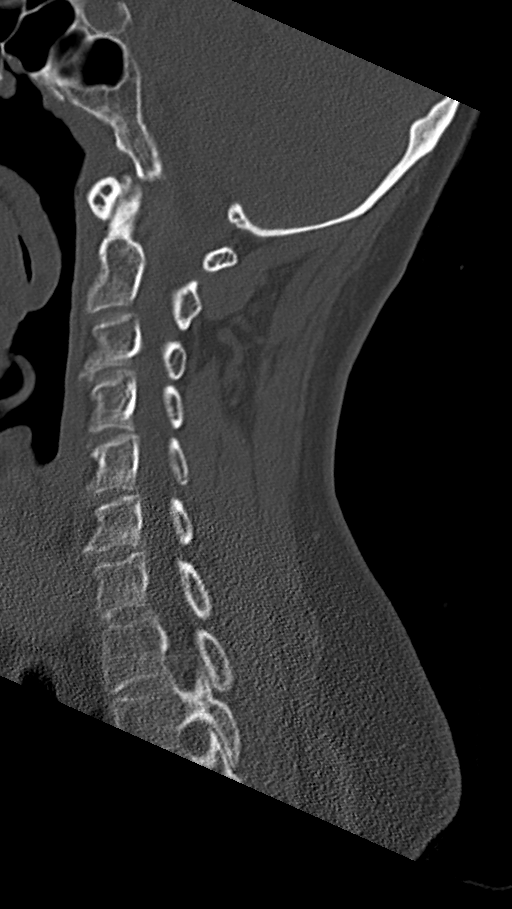
[im 46/69  bone]
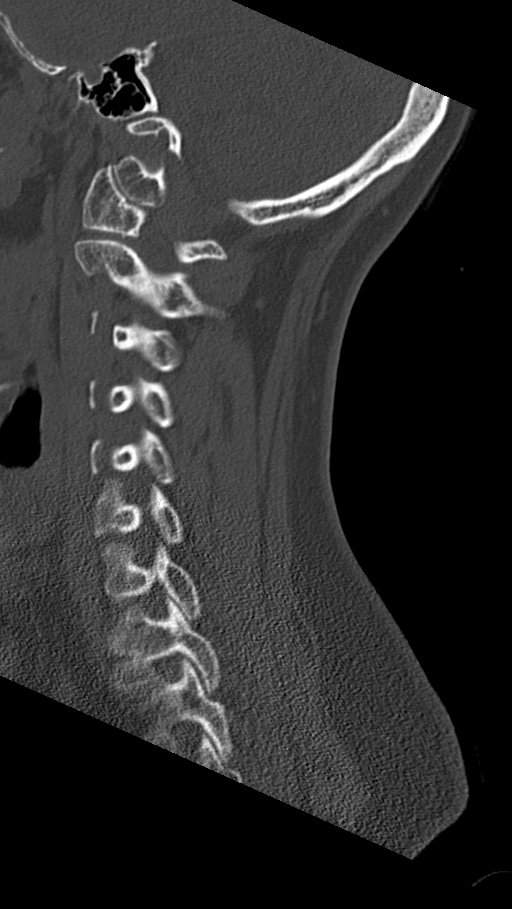
[im 57/69  bone]
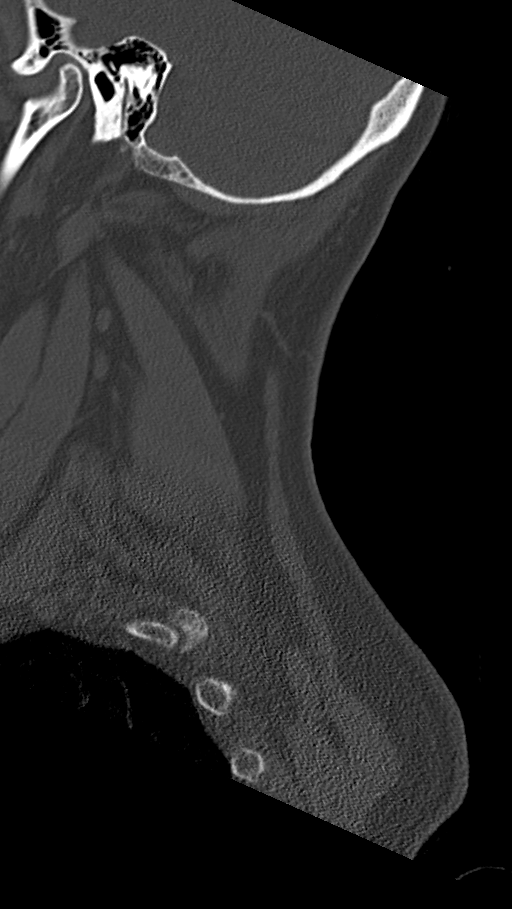

[13 of 33 positions shown; findings below may reference images not displayed]

FINDINGS: CT HEAD FINDINGS

Brain: No midline shift, ventriculomegaly, mass effect, evidence of
mass lesion, intracranial hemorrhage or evidence of cortically based
acute infarction. Gray-white matter differentiation is within normal
limits throughout the brain. No encephalomalacia identified.

Vascular: Mild Calcified atherosclerosis at the skull base. No
suspicious intracranial vascular hyperdensity.

Skull: No acute osseous abnormality identified.

Sinuses/Orbits: Clear.

Other: Visualized orbit soft tissues are within normal limits. No
acute scalp soft tissue finding. Mild left forehead scalp scarring.

CT CERVICAL SPINE FINDINGS

Alignment: Straightening of cervical lordosis. Bilateral posterior
element alignment is within normal limits. Cervicothoracic junction
alignment is within normal limits.

Skull base and vertebrae: Osteopenia. Visualized skull base is
intact. No atlanto-occipital dissociation. Congenital incomplete
ossification of the posterior C1 ring. No cervical spine fracture
identified.

Soft tissues and spinal canal: No prevertebral fluid or swelling. No
visible canal hematoma.

Disc levels:  Normal for age.

Upper chest: T1 spina bifida occulta (normal variant). Visible upper
thoracic levels appear intact. Apical lung scarring greater on the
right. Chronic right thyroid lower pole nodule.
IMPRESSION: 1.  Normal noncontrast CT appearance of the brain.
2. Negative for age CT appearance of the cervical spine.
3. Chronic thyroid goiter.

## 2018-11-02 DIAGNOSIS — R5383 Other fatigue: Secondary | ICD-10-CM | POA: Diagnosis not present

## 2018-11-02 DIAGNOSIS — G4733 Obstructive sleep apnea (adult) (pediatric): Secondary | ICD-10-CM | POA: Diagnosis not present

## 2018-11-02 DIAGNOSIS — E559 Vitamin D deficiency, unspecified: Secondary | ICD-10-CM | POA: Diagnosis not present

## 2018-11-02 DIAGNOSIS — F17210 Nicotine dependence, cigarettes, uncomplicated: Secondary | ICD-10-CM | POA: Diagnosis not present

## 2018-11-02 DIAGNOSIS — J4550 Severe persistent asthma, uncomplicated: Secondary | ICD-10-CM | POA: Diagnosis not present

## 2018-11-10 DIAGNOSIS — D120 Benign neoplasm of cecum: Secondary | ICD-10-CM | POA: Diagnosis not present

## 2018-11-10 DIAGNOSIS — D123 Benign neoplasm of transverse colon: Secondary | ICD-10-CM | POA: Diagnosis not present

## 2018-11-10 DIAGNOSIS — K635 Polyp of colon: Secondary | ICD-10-CM | POA: Diagnosis not present

## 2018-11-10 DIAGNOSIS — K648 Other hemorrhoids: Secondary | ICD-10-CM | POA: Diagnosis not present

## 2018-11-10 DIAGNOSIS — D126 Benign neoplasm of colon, unspecified: Secondary | ICD-10-CM | POA: Diagnosis not present

## 2018-11-10 DIAGNOSIS — K5730 Diverticulosis of large intestine without perforation or abscess without bleeding: Secondary | ICD-10-CM | POA: Diagnosis not present

## 2018-11-10 DIAGNOSIS — K921 Melena: Secondary | ICD-10-CM | POA: Diagnosis not present

## 2018-11-17 DIAGNOSIS — R5383 Other fatigue: Secondary | ICD-10-CM | POA: Diagnosis not present

## 2018-11-17 DIAGNOSIS — G4733 Obstructive sleep apnea (adult) (pediatric): Secondary | ICD-10-CM | POA: Diagnosis not present

## 2018-11-17 DIAGNOSIS — F17210 Nicotine dependence, cigarettes, uncomplicated: Secondary | ICD-10-CM | POA: Diagnosis not present

## 2018-11-17 DIAGNOSIS — J4550 Severe persistent asthma, uncomplicated: Secondary | ICD-10-CM | POA: Diagnosis not present

## 2018-11-17 DIAGNOSIS — Z711 Person with feared health complaint in whom no diagnosis is made: Secondary | ICD-10-CM | POA: Diagnosis not present

## 2018-11-24 DIAGNOSIS — J449 Chronic obstructive pulmonary disease, unspecified: Secondary | ICD-10-CM | POA: Diagnosis not present

## 2018-11-24 DIAGNOSIS — R0600 Dyspnea, unspecified: Secondary | ICD-10-CM | POA: Diagnosis not present

## 2018-11-24 DIAGNOSIS — R5383 Other fatigue: Secondary | ICD-10-CM | POA: Diagnosis not present

## 2018-11-24 DIAGNOSIS — R911 Solitary pulmonary nodule: Secondary | ICD-10-CM | POA: Diagnosis not present

## 2018-11-24 DIAGNOSIS — F17210 Nicotine dependence, cigarettes, uncomplicated: Secondary | ICD-10-CM | POA: Diagnosis not present

## 2018-11-25 DIAGNOSIS — E041 Nontoxic single thyroid nodule: Secondary | ICD-10-CM | POA: Diagnosis not present

## 2018-11-29 DIAGNOSIS — F333 Major depressive disorder, recurrent, severe with psychotic symptoms: Secondary | ICD-10-CM | POA: Diagnosis not present

## 2018-12-01 DIAGNOSIS — I2510 Atherosclerotic heart disease of native coronary artery without angina pectoris: Secondary | ICD-10-CM | POA: Diagnosis not present

## 2018-12-01 DIAGNOSIS — J439 Emphysema, unspecified: Secondary | ICD-10-CM | POA: Diagnosis not present

## 2018-12-01 DIAGNOSIS — R911 Solitary pulmonary nodule: Secondary | ICD-10-CM | POA: Diagnosis not present

## 2018-12-01 DIAGNOSIS — J849 Interstitial pulmonary disease, unspecified: Secondary | ICD-10-CM | POA: Diagnosis not present

## 2018-12-01 DIAGNOSIS — I700 Atherosclerosis of aorta: Secondary | ICD-10-CM | POA: Diagnosis not present

## 2018-12-01 DIAGNOSIS — R918 Other nonspecific abnormal finding of lung field: Secondary | ICD-10-CM | POA: Diagnosis not present

## 2018-12-01 DIAGNOSIS — K760 Fatty (change of) liver, not elsewhere classified: Secondary | ICD-10-CM | POA: Diagnosis not present

## 2018-12-14 DIAGNOSIS — R911 Solitary pulmonary nodule: Secondary | ICD-10-CM | POA: Diagnosis not present

## 2018-12-14 DIAGNOSIS — J449 Chronic obstructive pulmonary disease, unspecified: Secondary | ICD-10-CM | POA: Diagnosis not present

## 2018-12-14 DIAGNOSIS — F17210 Nicotine dependence, cigarettes, uncomplicated: Secondary | ICD-10-CM | POA: Diagnosis not present

## 2018-12-14 DIAGNOSIS — J84115 Respiratory bronchiolitis interstitial lung disease: Secondary | ICD-10-CM | POA: Diagnosis not present

## 2019-01-26 DIAGNOSIS — R911 Solitary pulmonary nodule: Secondary | ICD-10-CM | POA: Diagnosis not present

## 2019-01-26 DIAGNOSIS — J84115 Respiratory bronchiolitis interstitial lung disease: Secondary | ICD-10-CM | POA: Diagnosis not present

## 2019-01-26 DIAGNOSIS — F17210 Nicotine dependence, cigarettes, uncomplicated: Secondary | ICD-10-CM | POA: Diagnosis not present

## 2019-01-26 DIAGNOSIS — J441 Chronic obstructive pulmonary disease with (acute) exacerbation: Secondary | ICD-10-CM | POA: Diagnosis not present

## 2019-02-09 DIAGNOSIS — R0600 Dyspnea, unspecified: Secondary | ICD-10-CM | POA: Diagnosis not present

## 2019-02-09 DIAGNOSIS — J84115 Respiratory bronchiolitis interstitial lung disease: Secondary | ICD-10-CM | POA: Diagnosis not present

## 2019-02-09 DIAGNOSIS — J441 Chronic obstructive pulmonary disease with (acute) exacerbation: Secondary | ICD-10-CM | POA: Diagnosis not present

## 2019-02-09 DIAGNOSIS — F17210 Nicotine dependence, cigarettes, uncomplicated: Secondary | ICD-10-CM | POA: Diagnosis not present

## 2019-02-09 DIAGNOSIS — R05 Cough: Secondary | ICD-10-CM | POA: Diagnosis not present

## 2019-02-09 DIAGNOSIS — R5383 Other fatigue: Secondary | ICD-10-CM | POA: Diagnosis not present

## 2019-02-09 DIAGNOSIS — R911 Solitary pulmonary nodule: Secondary | ICD-10-CM | POA: Diagnosis not present

## 2019-03-01 DIAGNOSIS — F333 Major depressive disorder, recurrent, severe with psychotic symptoms: Secondary | ICD-10-CM | POA: Diagnosis not present

## 2019-03-04 DIAGNOSIS — E7800 Pure hypercholesterolemia, unspecified: Secondary | ICD-10-CM | POA: Diagnosis not present

## 2019-03-04 DIAGNOSIS — M1990 Unspecified osteoarthritis, unspecified site: Secondary | ICD-10-CM | POA: Diagnosis not present

## 2019-03-04 DIAGNOSIS — E041 Nontoxic single thyroid nodule: Secondary | ICD-10-CM | POA: Diagnosis not present

## 2019-03-04 DIAGNOSIS — E559 Vitamin D deficiency, unspecified: Secondary | ICD-10-CM | POA: Diagnosis not present

## 2019-03-04 DIAGNOSIS — D72829 Elevated white blood cell count, unspecified: Secondary | ICD-10-CM | POA: Diagnosis not present

## 2019-03-04 DIAGNOSIS — E1169 Type 2 diabetes mellitus with other specified complication: Secondary | ICD-10-CM | POA: Diagnosis not present

## 2019-03-18 DIAGNOSIS — R911 Solitary pulmonary nodule: Secondary | ICD-10-CM | POA: Diagnosis not present

## 2019-03-18 DIAGNOSIS — F17210 Nicotine dependence, cigarettes, uncomplicated: Secondary | ICD-10-CM | POA: Diagnosis not present

## 2019-03-18 DIAGNOSIS — J9621 Acute and chronic respiratory failure with hypoxia: Secondary | ICD-10-CM | POA: Diagnosis not present

## 2019-03-18 DIAGNOSIS — R5383 Other fatigue: Secondary | ICD-10-CM | POA: Diagnosis not present

## 2019-03-18 DIAGNOSIS — J84115 Respiratory bronchiolitis interstitial lung disease: Secondary | ICD-10-CM | POA: Diagnosis not present

## 2019-03-18 DIAGNOSIS — J449 Chronic obstructive pulmonary disease, unspecified: Secondary | ICD-10-CM | POA: Diagnosis not present

## 2019-03-18 DIAGNOSIS — J189 Pneumonia, unspecified organism: Secondary | ICD-10-CM | POA: Diagnosis not present

## 2019-03-28 DIAGNOSIS — R911 Solitary pulmonary nodule: Secondary | ICD-10-CM | POA: Diagnosis not present

## 2019-03-28 DIAGNOSIS — R5383 Other fatigue: Secondary | ICD-10-CM | POA: Diagnosis not present

## 2019-03-28 DIAGNOSIS — J189 Pneumonia, unspecified organism: Secondary | ICD-10-CM | POA: Diagnosis not present

## 2019-03-28 DIAGNOSIS — R0600 Dyspnea, unspecified: Secondary | ICD-10-CM | POA: Diagnosis not present

## 2019-03-28 DIAGNOSIS — J9621 Acute and chronic respiratory failure with hypoxia: Secondary | ICD-10-CM | POA: Diagnosis not present

## 2019-03-28 DIAGNOSIS — J84115 Respiratory bronchiolitis interstitial lung disease: Secondary | ICD-10-CM | POA: Diagnosis not present

## 2019-03-28 DIAGNOSIS — F17210 Nicotine dependence, cigarettes, uncomplicated: Secondary | ICD-10-CM | POA: Diagnosis not present

## 2019-03-28 DIAGNOSIS — J449 Chronic obstructive pulmonary disease, unspecified: Secondary | ICD-10-CM | POA: Diagnosis not present

## 2019-03-31 DIAGNOSIS — D473 Essential (hemorrhagic) thrombocythemia: Secondary | ICD-10-CM | POA: Diagnosis not present

## 2019-03-31 DIAGNOSIS — D72829 Elevated white blood cell count, unspecified: Secondary | ICD-10-CM | POA: Diagnosis not present

## 2019-04-06 DIAGNOSIS — M791 Myalgia, unspecified site: Secondary | ICD-10-CM | POA: Diagnosis not present

## 2019-04-11 DIAGNOSIS — J449 Chronic obstructive pulmonary disease, unspecified: Secondary | ICD-10-CM | POA: Diagnosis not present

## 2019-04-11 DIAGNOSIS — J84115 Respiratory bronchiolitis interstitial lung disease: Secondary | ICD-10-CM | POA: Diagnosis not present

## 2019-04-11 DIAGNOSIS — J189 Pneumonia, unspecified organism: Secondary | ICD-10-CM | POA: Diagnosis not present

## 2019-04-11 DIAGNOSIS — R911 Solitary pulmonary nodule: Secondary | ICD-10-CM | POA: Diagnosis not present

## 2019-04-11 DIAGNOSIS — F17210 Nicotine dependence, cigarettes, uncomplicated: Secondary | ICD-10-CM | POA: Diagnosis not present

## 2019-04-11 DIAGNOSIS — R5383 Other fatigue: Secondary | ICD-10-CM | POA: Diagnosis not present

## 2019-04-11 DIAGNOSIS — R0600 Dyspnea, unspecified: Secondary | ICD-10-CM | POA: Diagnosis not present

## 2019-04-11 DIAGNOSIS — J9621 Acute and chronic respiratory failure with hypoxia: Secondary | ICD-10-CM | POA: Diagnosis not present

## 2019-04-12 DIAGNOSIS — R5383 Other fatigue: Secondary | ICD-10-CM | POA: Diagnosis not present

## 2019-04-12 DIAGNOSIS — R05 Cough: Secondary | ICD-10-CM | POA: Diagnosis not present

## 2019-04-12 DIAGNOSIS — F17210 Nicotine dependence, cigarettes, uncomplicated: Secondary | ICD-10-CM | POA: Diagnosis not present

## 2019-04-12 DIAGNOSIS — J189 Pneumonia, unspecified organism: Secondary | ICD-10-CM | POA: Diagnosis not present

## 2019-04-12 DIAGNOSIS — J84115 Respiratory bronchiolitis interstitial lung disease: Secondary | ICD-10-CM | POA: Diagnosis not present

## 2019-04-12 DIAGNOSIS — R911 Solitary pulmonary nodule: Secondary | ICD-10-CM | POA: Diagnosis not present

## 2019-04-12 DIAGNOSIS — J449 Chronic obstructive pulmonary disease, unspecified: Secondary | ICD-10-CM | POA: Diagnosis not present

## 2019-04-12 DIAGNOSIS — R0600 Dyspnea, unspecified: Secondary | ICD-10-CM | POA: Diagnosis not present

## 2019-04-12 DIAGNOSIS — J9621 Acute and chronic respiratory failure with hypoxia: Secondary | ICD-10-CM | POA: Diagnosis not present

## 2019-04-13 DIAGNOSIS — R5383 Other fatigue: Secondary | ICD-10-CM | POA: Diagnosis not present

## 2019-04-13 DIAGNOSIS — R0600 Dyspnea, unspecified: Secondary | ICD-10-CM | POA: Diagnosis not present

## 2019-04-13 DIAGNOSIS — R911 Solitary pulmonary nodule: Secondary | ICD-10-CM | POA: Diagnosis not present

## 2019-04-13 DIAGNOSIS — J189 Pneumonia, unspecified organism: Secondary | ICD-10-CM | POA: Diagnosis not present

## 2019-04-13 DIAGNOSIS — J9621 Acute and chronic respiratory failure with hypoxia: Secondary | ICD-10-CM | POA: Diagnosis not present

## 2019-04-13 DIAGNOSIS — F17210 Nicotine dependence, cigarettes, uncomplicated: Secondary | ICD-10-CM | POA: Diagnosis not present

## 2019-04-13 DIAGNOSIS — J449 Chronic obstructive pulmonary disease, unspecified: Secondary | ICD-10-CM | POA: Diagnosis not present

## 2019-04-13 DIAGNOSIS — J84115 Respiratory bronchiolitis interstitial lung disease: Secondary | ICD-10-CM | POA: Diagnosis not present

## 2019-04-20 DIAGNOSIS — J84115 Respiratory bronchiolitis interstitial lung disease: Secondary | ICD-10-CM | POA: Diagnosis not present

## 2019-04-20 DIAGNOSIS — J449 Chronic obstructive pulmonary disease, unspecified: Secondary | ICD-10-CM | POA: Diagnosis not present

## 2019-04-20 DIAGNOSIS — R911 Solitary pulmonary nodule: Secondary | ICD-10-CM | POA: Diagnosis not present

## 2019-04-20 DIAGNOSIS — F17210 Nicotine dependence, cigarettes, uncomplicated: Secondary | ICD-10-CM | POA: Diagnosis not present

## 2019-05-17 DIAGNOSIS — R0602 Shortness of breath: Secondary | ICD-10-CM | POA: Diagnosis not present

## 2019-05-17 DIAGNOSIS — R0902 Hypoxemia: Secondary | ICD-10-CM | POA: Diagnosis not present

## 2019-05-17 DIAGNOSIS — J449 Chronic obstructive pulmonary disease, unspecified: Secondary | ICD-10-CM | POA: Diagnosis not present

## 2019-05-17 DIAGNOSIS — R110 Nausea: Secondary | ICD-10-CM | POA: Diagnosis not present

## 2019-05-17 DIAGNOSIS — E1165 Type 2 diabetes mellitus with hyperglycemia: Secondary | ICD-10-CM | POA: Diagnosis not present

## 2019-05-17 DIAGNOSIS — E1065 Type 1 diabetes mellitus with hyperglycemia: Secondary | ICD-10-CM | POA: Diagnosis not present

## 2019-05-17 DIAGNOSIS — R262 Difficulty in walking, not elsewhere classified: Secondary | ICD-10-CM | POA: Diagnosis not present

## 2019-05-17 DIAGNOSIS — R42 Dizziness and giddiness: Secondary | ICD-10-CM | POA: Diagnosis not present

## 2019-05-17 DIAGNOSIS — F17210 Nicotine dependence, cigarettes, uncomplicated: Secondary | ICD-10-CM | POA: Diagnosis not present

## 2019-05-18 DIAGNOSIS — R197 Diarrhea, unspecified: Secondary | ICD-10-CM | POA: Diagnosis not present

## 2019-05-18 DIAGNOSIS — R0902 Hypoxemia: Secondary | ICD-10-CM | POA: Diagnosis not present

## 2019-05-18 DIAGNOSIS — E1165 Type 2 diabetes mellitus with hyperglycemia: Secondary | ICD-10-CM | POA: Diagnosis not present

## 2019-05-18 DIAGNOSIS — R110 Nausea: Secondary | ICD-10-CM | POA: Diagnosis not present

## 2019-05-18 DIAGNOSIS — E1065 Type 1 diabetes mellitus with hyperglycemia: Secondary | ICD-10-CM | POA: Diagnosis not present

## 2019-05-18 DIAGNOSIS — R531 Weakness: Secondary | ICD-10-CM | POA: Diagnosis not present

## 2019-05-18 DIAGNOSIS — R0602 Shortness of breath: Secondary | ICD-10-CM | POA: Diagnosis not present

## 2019-05-18 DIAGNOSIS — R739 Hyperglycemia, unspecified: Secondary | ICD-10-CM | POA: Diagnosis not present

## 2019-05-18 DIAGNOSIS — Z79899 Other long term (current) drug therapy: Secondary | ICD-10-CM | POA: Diagnosis not present

## 2019-05-18 DIAGNOSIS — F17210 Nicotine dependence, cigarettes, uncomplicated: Secondary | ICD-10-CM | POA: Diagnosis not present

## 2019-05-24 DIAGNOSIS — M810 Age-related osteoporosis without current pathological fracture: Secondary | ICD-10-CM | POA: Diagnosis not present

## 2019-05-24 DIAGNOSIS — E785 Hyperlipidemia, unspecified: Secondary | ICD-10-CM | POA: Diagnosis not present

## 2019-05-24 DIAGNOSIS — R0781 Pleurodynia: Secondary | ICD-10-CM | POA: Diagnosis not present

## 2019-05-25 DIAGNOSIS — E1169 Type 2 diabetes mellitus with other specified complication: Secondary | ICD-10-CM | POA: Diagnosis not present

## 2019-05-26 DIAGNOSIS — Z9181 History of falling: Secondary | ICD-10-CM | POA: Diagnosis not present

## 2019-05-26 DIAGNOSIS — N959 Unspecified menopausal and perimenopausal disorder: Secondary | ICD-10-CM | POA: Diagnosis not present

## 2019-05-26 DIAGNOSIS — Z0000 Encounter for general adult medical examination without abnormal findings: Secondary | ICD-10-CM | POA: Diagnosis not present

## 2019-05-26 DIAGNOSIS — Z1231 Encounter for screening mammogram for malignant neoplasm of breast: Secondary | ICD-10-CM | POA: Diagnosis not present

## 2019-05-26 DIAGNOSIS — Z1331 Encounter for screening for depression: Secondary | ICD-10-CM | POA: Diagnosis not present

## 2019-05-26 DIAGNOSIS — E785 Hyperlipidemia, unspecified: Secondary | ICD-10-CM | POA: Diagnosis not present

## 2019-06-06 DIAGNOSIS — R112 Nausea with vomiting, unspecified: Secondary | ICD-10-CM | POA: Diagnosis not present

## 2019-06-10 DIAGNOSIS — H538 Other visual disturbances: Secondary | ICD-10-CM | POA: Diagnosis not present

## 2019-06-10 DIAGNOSIS — E785 Hyperlipidemia, unspecified: Secondary | ICD-10-CM | POA: Diagnosis not present

## 2019-06-10 DIAGNOSIS — D473 Essential (hemorrhagic) thrombocythemia: Secondary | ICD-10-CM | POA: Diagnosis not present

## 2019-06-10 DIAGNOSIS — J449 Chronic obstructive pulmonary disease, unspecified: Secondary | ICD-10-CM | POA: Diagnosis not present

## 2019-06-10 DIAGNOSIS — E041 Nontoxic single thyroid nodule: Secondary | ICD-10-CM | POA: Diagnosis not present

## 2019-06-10 DIAGNOSIS — E1169 Type 2 diabetes mellitus with other specified complication: Secondary | ICD-10-CM | POA: Diagnosis not present

## 2019-06-13 DIAGNOSIS — E119 Type 2 diabetes mellitus without complications: Secondary | ICD-10-CM | POA: Diagnosis not present

## 2019-07-13 DIAGNOSIS — H524 Presbyopia: Secondary | ICD-10-CM | POA: Diagnosis not present

## 2019-07-15 DIAGNOSIS — H524 Presbyopia: Secondary | ICD-10-CM | POA: Diagnosis not present

## 2019-08-05 DIAGNOSIS — Z1231 Encounter for screening mammogram for malignant neoplasm of breast: Secondary | ICD-10-CM | POA: Diagnosis not present

## 2019-08-05 DIAGNOSIS — M810 Age-related osteoporosis without current pathological fracture: Secondary | ICD-10-CM | POA: Diagnosis not present

## 2019-08-05 DIAGNOSIS — M85851 Other specified disorders of bone density and structure, right thigh: Secondary | ICD-10-CM | POA: Diagnosis not present

## 2019-08-05 DIAGNOSIS — N959 Unspecified menopausal and perimenopausal disorder: Secondary | ICD-10-CM | POA: Diagnosis not present

## 2019-08-29 DIAGNOSIS — F333 Major depressive disorder, recurrent, severe with psychotic symptoms: Secondary | ICD-10-CM | POA: Diagnosis not present

## 2019-09-02 DIAGNOSIS — E785 Hyperlipidemia, unspecified: Secondary | ICD-10-CM | POA: Diagnosis not present

## 2019-09-02 DIAGNOSIS — Z23 Encounter for immunization: Secondary | ICD-10-CM | POA: Diagnosis not present

## 2019-09-02 DIAGNOSIS — E1169 Type 2 diabetes mellitus with other specified complication: Secondary | ICD-10-CM | POA: Diagnosis not present

## 2019-09-02 DIAGNOSIS — E041 Nontoxic single thyroid nodule: Secondary | ICD-10-CM | POA: Diagnosis not present

## 2019-09-02 DIAGNOSIS — D473 Essential (hemorrhagic) thrombocythemia: Secondary | ICD-10-CM | POA: Diagnosis not present

## 2019-09-02 DIAGNOSIS — J449 Chronic obstructive pulmonary disease, unspecified: Secondary | ICD-10-CM | POA: Diagnosis not present

## 2019-09-13 DIAGNOSIS — E8889 Other specified metabolic disorders: Secondary | ICD-10-CM | POA: Diagnosis not present

## 2019-09-13 DIAGNOSIS — J439 Emphysema, unspecified: Secondary | ICD-10-CM | POA: Diagnosis not present

## 2019-09-13 DIAGNOSIS — R911 Solitary pulmonary nodule: Secondary | ICD-10-CM | POA: Diagnosis not present

## 2019-09-13 DIAGNOSIS — R590 Localized enlarged lymph nodes: Secondary | ICD-10-CM | POA: Diagnosis not present

## 2019-09-13 DIAGNOSIS — I2510 Atherosclerotic heart disease of native coronary artery without angina pectoris: Secondary | ICD-10-CM | POA: Diagnosis not present

## 2019-09-13 DIAGNOSIS — I700 Atherosclerosis of aorta: Secondary | ICD-10-CM | POA: Diagnosis not present

## 2019-09-13 DIAGNOSIS — K760 Fatty (change of) liver, not elsewhere classified: Secondary | ICD-10-CM | POA: Diagnosis not present

## 2019-09-14 DIAGNOSIS — F17210 Nicotine dependence, cigarettes, uncomplicated: Secondary | ICD-10-CM | POA: Diagnosis not present

## 2019-09-14 DIAGNOSIS — R911 Solitary pulmonary nodule: Secondary | ICD-10-CM | POA: Diagnosis not present

## 2019-09-14 DIAGNOSIS — J449 Chronic obstructive pulmonary disease, unspecified: Secondary | ICD-10-CM | POA: Diagnosis not present

## 2019-09-14 DIAGNOSIS — J84115 Respiratory bronchiolitis interstitial lung disease: Secondary | ICD-10-CM | POA: Diagnosis not present

## 2019-09-19 DIAGNOSIS — E7800 Pure hypercholesterolemia, unspecified: Secondary | ICD-10-CM | POA: Diagnosis not present

## 2019-09-26 DIAGNOSIS — E1169 Type 2 diabetes mellitus with other specified complication: Secondary | ICD-10-CM | POA: Diagnosis not present

## 2019-09-26 DIAGNOSIS — E785 Hyperlipidemia, unspecified: Secondary | ICD-10-CM | POA: Diagnosis not present

## 2019-10-06 DIAGNOSIS — Z794 Long term (current) use of insulin: Secondary | ICD-10-CM | POA: Diagnosis not present

## 2019-10-06 DIAGNOSIS — E1165 Type 2 diabetes mellitus with hyperglycemia: Secondary | ICD-10-CM | POA: Diagnosis not present

## 2019-10-10 DIAGNOSIS — J84115 Respiratory bronchiolitis interstitial lung disease: Secondary | ICD-10-CM | POA: Diagnosis not present

## 2019-10-10 DIAGNOSIS — R911 Solitary pulmonary nodule: Secondary | ICD-10-CM | POA: Diagnosis not present

## 2019-10-12 DIAGNOSIS — E785 Hyperlipidemia, unspecified: Secondary | ICD-10-CM | POA: Diagnosis not present

## 2019-10-12 DIAGNOSIS — E1169 Type 2 diabetes mellitus with other specified complication: Secondary | ICD-10-CM | POA: Diagnosis not present

## 2019-10-14 DIAGNOSIS — F17210 Nicotine dependence, cigarettes, uncomplicated: Secondary | ICD-10-CM | POA: Diagnosis not present

## 2019-10-14 DIAGNOSIS — J449 Chronic obstructive pulmonary disease, unspecified: Secondary | ICD-10-CM | POA: Diagnosis not present

## 2019-10-14 DIAGNOSIS — R911 Solitary pulmonary nodule: Secondary | ICD-10-CM | POA: Diagnosis not present

## 2019-10-14 DIAGNOSIS — J84115 Respiratory bronchiolitis interstitial lung disease: Secondary | ICD-10-CM | POA: Diagnosis not present

## 2019-10-17 DIAGNOSIS — F329 Major depressive disorder, single episode, unspecified: Secondary | ICD-10-CM | POA: Diagnosis not present

## 2019-10-17 DIAGNOSIS — E079 Disorder of thyroid, unspecified: Secondary | ICD-10-CM | POA: Diagnosis not present

## 2019-10-17 DIAGNOSIS — F419 Anxiety disorder, unspecified: Secondary | ICD-10-CM | POA: Diagnosis not present

## 2019-10-17 DIAGNOSIS — Z6828 Body mass index (BMI) 28.0-28.9, adult: Secondary | ICD-10-CM | POA: Diagnosis not present

## 2019-10-19 DIAGNOSIS — E041 Nontoxic single thyroid nodule: Secondary | ICD-10-CM | POA: Diagnosis not present

## 2019-10-19 DIAGNOSIS — D381 Neoplasm of uncertain behavior of trachea, bronchus and lung: Secondary | ICD-10-CM | POA: Diagnosis not present

## 2019-10-19 DIAGNOSIS — E119 Type 2 diabetes mellitus without complications: Secondary | ICD-10-CM | POA: Diagnosis not present

## 2019-10-19 DIAGNOSIS — K219 Gastro-esophageal reflux disease without esophagitis: Secondary | ICD-10-CM | POA: Diagnosis not present

## 2019-10-25 DIAGNOSIS — E1169 Type 2 diabetes mellitus with other specified complication: Secondary | ICD-10-CM | POA: Diagnosis not present

## 2019-10-25 DIAGNOSIS — E785 Hyperlipidemia, unspecified: Secondary | ICD-10-CM | POA: Diagnosis not present

## 2019-10-25 DIAGNOSIS — Z20828 Contact with and (suspected) exposure to other viral communicable diseases: Secondary | ICD-10-CM | POA: Diagnosis not present

## 2019-11-02 DIAGNOSIS — D473 Essential (hemorrhagic) thrombocythemia: Secondary | ICD-10-CM | POA: Diagnosis not present

## 2019-11-02 DIAGNOSIS — D72829 Elevated white blood cell count, unspecified: Secondary | ICD-10-CM | POA: Diagnosis not present

## 2019-11-02 DIAGNOSIS — F17210 Nicotine dependence, cigarettes, uncomplicated: Secondary | ICD-10-CM | POA: Diagnosis not present

## 2019-11-02 DIAGNOSIS — R918 Other nonspecific abnormal finding of lung field: Secondary | ICD-10-CM | POA: Diagnosis not present

## 2019-11-09 DIAGNOSIS — J449 Chronic obstructive pulmonary disease, unspecified: Secondary | ICD-10-CM | POA: Diagnosis not present

## 2019-11-09 DIAGNOSIS — J84115 Respiratory bronchiolitis interstitial lung disease: Secondary | ICD-10-CM | POA: Diagnosis not present

## 2019-11-09 DIAGNOSIS — F17210 Nicotine dependence, cigarettes, uncomplicated: Secondary | ICD-10-CM | POA: Diagnosis not present

## 2019-11-09 DIAGNOSIS — R911 Solitary pulmonary nodule: Secondary | ICD-10-CM | POA: Diagnosis not present

## 2019-11-22 ENCOUNTER — Other Ambulatory Visit: Payer: Self-pay

## 2019-11-22 ENCOUNTER — Ambulatory Visit (INDEPENDENT_AMBULATORY_CARE_PROVIDER_SITE_OTHER): Payer: Medicare Other | Admitting: Pulmonary Disease

## 2019-11-22 ENCOUNTER — Encounter: Payer: Self-pay | Admitting: Pulmonary Disease

## 2019-11-22 VITALS — BP 122/78 | HR 95 | Temp 98.0°F | Ht 61.0 in | Wt 110.8 lb

## 2019-11-22 DIAGNOSIS — F17200 Nicotine dependence, unspecified, uncomplicated: Secondary | ICD-10-CM

## 2019-11-22 DIAGNOSIS — Z01818 Encounter for other preprocedural examination: Secondary | ICD-10-CM

## 2019-11-22 DIAGNOSIS — Z9189 Other specified personal risk factors, not elsewhere classified: Secondary | ICD-10-CM | POA: Diagnosis not present

## 2019-11-22 DIAGNOSIS — R05 Cough: Secondary | ICD-10-CM

## 2019-11-22 DIAGNOSIS — R590 Localized enlarged lymph nodes: Secondary | ICD-10-CM | POA: Diagnosis not present

## 2019-11-22 DIAGNOSIS — R059 Cough, unspecified: Secondary | ICD-10-CM

## 2019-11-22 DIAGNOSIS — R942 Abnormal results of pulmonary function studies: Secondary | ICD-10-CM | POA: Diagnosis not present

## 2019-11-22 LAB — CBC
HCT: 42.7 % (ref 36.0–46.0)
Hemoglobin: 14.1 g/dL (ref 12.0–15.0)
MCHC: 33.1 g/dL (ref 30.0–36.0)
MCV: 88.8 fl (ref 78.0–100.0)
Platelets: 540 10*3/uL — ABNORMAL HIGH (ref 150.0–400.0)
RBC: 4.8 Mil/uL (ref 3.87–5.11)
RDW: 14.3 % (ref 11.5–15.5)
WBC: 15.3 10*3/uL — ABNORMAL HIGH (ref 4.0–10.5)

## 2019-11-22 LAB — COMPREHENSIVE METABOLIC PANEL
ALT: 48 U/L — ABNORMAL HIGH (ref 0–35)
AST: 32 U/L (ref 0–37)
Albumin: 3.8 g/dL (ref 3.5–5.2)
Alkaline Phosphatase: 107 U/L (ref 39–117)
BUN: 6 mg/dL (ref 6–23)
CO2: 27 mEq/L (ref 19–32)
Calcium: 9.3 mg/dL (ref 8.4–10.5)
Chloride: 104 mEq/L (ref 96–112)
Creatinine, Ser: 0.76 mg/dL (ref 0.40–1.20)
GFR: 77.82 mL/min (ref >60.00)
Glucose, Bld: 214 mg/dL — ABNORMAL HIGH (ref 70–99)
Potassium: 3.8 mEq/L (ref 3.5–5.1)
Sodium: 139 mEq/L (ref 135–145)
Total Bilirubin: 0.3 mg/dL (ref 0.2–1.2)
Total Protein: 6.8 g/dL (ref 6.0–8.3)

## 2019-11-22 NOTE — H&P (View-Only) (Signed)
Synopsis: Referred in December 2020 for abnormal CT chest by Marice Potter, MD  Subjective:   PATIENT ID: Angela Kane GENDER: female DOB: 06-Dec-1961, MRN: [id number]  Chief Complaint  Patient presents with  . Consult    Consult for possible biopsy. Reports she has thyroid cancer and wants to be sure it has not spread to her lungs. She reports a productive with white mucous.    PMH smoker seen by Dr. Bobby Rumpf at [facility]. She was found to have enlarged PET Avid adenopathy within the chest. She was referred here for evaluation for bronchoscopy EBUS and nodal sampling. Follows with Dr. Alcide Clever in [place] as well for her COPD. Currently taking trelegy and ventolin. Smoking since age 32 years, 1ppd.  Today we reviewed her PET scan imaging which reveals a SUV 4 subcarinal lymph node.  She unfortunately is still smoking.  She lives alone.  She smokes approximately 1 pack/day.  Patient denies hemoptysis, fevers, night sweats, weight loss.   Past Medical History:  Diagnosis Date  . History of degenerative disc disease      Family History  Problem Relation Age of Onset  . Heart disease Mother   . Diabetes Mother   . Diabetes Father   . Heart disease Father      Past Surgical History:  Procedure Laterality Date  . ABDOMINAL HYSTERECTOMY    . CHOLECYSTECTOMY      Social History   Socioeconomic History  . Marital status: Single    Spouse name: Not on file  . Number of children: Not on file  . Years of education: Not on file  . Highest education level: Not on file  Occupational History  . Not on file  Social Needs  . Financial resource strain: Not on file  . Food insecurity    Worry: Not on file    Inability: Not on file  . Transportation needs    Medical: Not on file    Non-medical: Not on file  Tobacco Use  . Smoking status: Current Every Day Smoker    Packs/day: 2.00  . Smokeless tobacco: Never Used  . Tobacco comment: smokes pack per day  12.8.2020  Substance and Sexual Activity  . Alcohol use: No    Frequency: Never  . Drug use: No  . Sexual activity: Not on file  Lifestyle  . Physical activity    Days per week: Not on file    Minutes per session: Not on file  . Stress: Not on file  Relationships  . Social Herbalist on phone: Not on file    Gets together: Not on file    Attends religious service: Not on file    Active member of club or organization: Not on file    Attends meetings of clubs or organizations: Not on file    Relationship status: Not on file  . Intimate partner violence    Fear of current or ex partner: Not on file    Emotionally abused: Not on file    Physically abused: Not on file    Forced sexual activity: Not on file  Other Topics Concern  . Not on file  Social History Narrative  . Not on file     Allergies  Allergen Reactions  . Amaranth (Fd&C Red #2) Hypertension  . Metrizamide Hypertension    IV DYE  . Betadine [Povidone Iodine] Itching    "Makes the inside of my mouth itch, makes me itch  internally, and it makes me want to scratch myself raw "  . Latex Itching  . Penicillins     Has patient had a PCN reaction causing immediate rash, facial/tongue/throat swelling, SOB or lightheadedness with hypotension: Yes Has patient had a PCN reaction causing severe rash involving mucus membranes or skin necrosis: Unknown Has patient had a PCN reaction that required hospitalization: Unknown Has patient had a PCN reaction occurring within the last 10 years:No If all of the above answers are "NO", then may proceed with Cephalosporin use.  **Childhood alergy     Outpatient Medications Prior to Visit  Medication Sig Dispense Refill  . albuterol (PROVENTIL HFA;VENTOLIN HFA) 108 (90 Base) MCG/ACT inhaler Inhale 2 puffs into the lungs every 6 (six) hours as needed for wheezing or shortness of breath.    Marland Kitchen alendronate (FOSAMAX) 70 MG tablet Take 70 mg by mouth once a week. On Sunday    .  atorvastatin (LIPITOR) 80 MG tablet Take 80 mg by mouth daily.    . clonazePAM (KLONOPIN) 0.5 MG tablet Take 0.5 mg by mouth 2 (two) times daily as needed.    . doxepin (SINEQUAN) 50 MG capsule Take 50 mg by mouth at bedtime.    . DULoxetine (CYMBALTA) 60 MG capsule Take 60 mg by mouth daily.    . famotidine (PEPCID) 20 MG tablet Take 20 mg by mouth 2 (two) times daily.    Marland Kitchen gabapentin (NEURONTIN) 400 MG capsule Take 2 capsules (800 mg total) by mouth 3 (three) times daily. 60 capsule 0  . hydrOXYzine (ATARAX/VISTARIL) 25 MG tablet Take by mouth.    Marland Kitchen ipratropium-albuterol (DUONEB) 0.5-2.5 (3) MG/3ML SOLN     . LANTUS SOLOSTAR 100 UNIT/ML Solostar Pen Inject 50 Units into the skin at bedtime.    Marland Kitchen lisinopril (ZESTRIL) 5 MG tablet Take 5 mg by mouth daily.    Marland Kitchen LORazepam (ATIVAN) 0.5 MG tablet Take 0.5 mg by mouth at bedtime.     . meloxicam (MOBIC) 15 MG tablet Take 15 mg by mouth daily.    Marland Kitchen NOVOLOG FLEXPEN 100 UNIT/ML FlexPen     . omeprazole (PRILOSEC) 20 MG capsule Take 20 mg by mouth daily.    . OXcarbazepine (TRILEPTAL) 150 MG tablet Take 1 tablet (150 mg total) by mouth 2 (two) times daily. 60 tablet 0  . ranitidine (ZANTAC) 150 MG tablet Take 150 mg by mouth 2 (two) times daily.    . risperiDONE (RISPERDAL) 2 MG tablet     . TRELEGY ELLIPTA 100-62.5-25 MCG/INH AEPB     . TRULICITY 1.5 LD/3.5TS SOPN     . Vitamin D, Ergocalciferol, (DRISDOL) 50000 units CAPS capsule Take 50,000 Units by mouth every 7 (seven) days. On Sunday     No facility-administered medications prior to visit.     Review of Systems  Constitutional: Negative for chills, fever, malaise/fatigue and weight loss.  HENT: Negative for hearing loss, sore throat and tinnitus.   Eyes: Negative for blurred vision and double vision.  Respiratory: Positive for cough, sputum production and shortness of breath. Negative for hemoptysis, wheezing and stridor.   Cardiovascular: Negative for chest pain, palpitations, orthopnea,  leg swelling and PND.  Gastrointestinal: Negative for abdominal pain, constipation, diarrhea, heartburn, nausea and vomiting.  Genitourinary: Negative for dysuria, hematuria and urgency.  Musculoskeletal: Negative for joint pain and myalgias.  Skin: Negative for itching and rash.  Neurological: Negative for dizziness, tingling, weakness and headaches.  Endo/Heme/Allergies: Negative for environmental allergies. Does not bruise/bleed easily.  Psychiatric/Behavioral: Negative for depression. The patient is not nervous/anxious and does not have insomnia.   All other systems reviewed and are negative.    Objective:  Physical Exam Vitals signs reviewed.  Constitutional:      General: She is not in acute distress.    Appearance: She is well-developed.  HENT:     Head: Normocephalic and atraumatic.     Mouth/Throat:     Pharynx: No oropharyngeal exudate.  Eyes:     Conjunctiva/sclera: Conjunctivae normal.     Pupils: Pupils are equal, round, and reactive to light.  Neck:     Vascular: No JVD.     Trachea: No tracheal deviation.     Comments: Loss of supraclavicular fat Cardiovascular:     Rate and Rhythm: Normal rate and regular rhythm.     Heart sounds: S1 normal and S2 normal.     Comments: Distant heart tones Pulmonary:     Effort: No tachypnea or accessory muscle usage.     Breath sounds: No stridor. Decreased breath sounds (throughout all lung fields) present. No wheezing, rhonchi or rales.     Comments: Diminished breath sounds bilaterally Abdominal:     General: Bowel sounds are normal. There is no distension.     Palpations: Abdomen is soft.     Tenderness: There is no abdominal tenderness.  Musculoskeletal:        General: Deformity (muscle wasting ) present.  Skin:    General: Skin is warm and dry.     Capillary Refill: Capillary refill takes less than 2 seconds.     Findings: No rash.  Neurological:     Mental Status: She is alert and oriented to person, place, and  time.  Psychiatric:        Behavior: Behavior normal.      Vitals:   11/22/19 1103  BP: 122/78  Pulse: 95  Temp: 98 F (36.7 C)  TempSrc: Temporal  SpO2: 96%  Weight: 110 lb 12.8 oz (50.3 kg)  Height: '5\' 1"'  (1.549 m)   96% on RA BMI Readings from Last 3 Encounters:  11/22/19 20.94 kg/m  02/05/18 25.61 kg/m  01/26/18 25.61 kg/m   Wt Readings from Last 3 Encounters:  11/22/19 110 lb 12.8 oz (50.3 kg)  02/05/18 140 lb (63.5 kg)  01/26/18 140 lb (63.5 kg)     CBC    Component Value Date/Time   WBC 15.9 (H) 02/05/2018 2059   RBC 5.90 (H) 02/05/2018 2059   HGB 18.3 (H) 02/05/2018 2059   HCT 53.4 (H) 02/05/2018 2059   PLT 377 02/05/2018 2059   MCV 90.5 02/05/2018 2059   MCH 31.0 02/05/2018 2059   MCHC 34.3 02/05/2018 2059   RDW 14.2 02/05/2018 2059   LYMPHSABS 5.0 (H) 02/05/2018 2059   MONOABS 0.8 02/05/2018 2059   EOSABS 0.2 02/05/2018 2059   BASOSABS 0.1 02/05/2018 2059     Chest Imaging:  PET Imaging - Able to view in PACS 1.9 cm, SUV 4.2, subcarinal node   Pulmonary Functions Testing Results: No flowsheet data found.  FeNO: None   Pathology: None  Echocardiogram: None  Heart Catheterization: None     Assessment & Plan:     ICD-10-CM   1. Mediastinal adenopathy  R59.0 Ambulatory referral to Pulmonology    CBC    Comp Met (CMET)  2. Smoker  F17.200 Ambulatory referral to Pulmonology    CBC    Comp Met (CMET)  3. Abnormal PET scan, lung  R94.2  Ambulatory referral to Pulmonology    CBC    Comp Met (CMET)  4. Pre-op evaluation  Z01.818 CBC    Comp Met (CMET)  5. Cough  R05 CBC    Comp Met (CMET)  6. At risk for bleeding  Z91.89 CBC    Comp Met (CMET)    Discussion:  This 58 year old female significant past medical history of tobacco abuse, smoked since the age of 13.  Recent PET scan imaging with PET avid thyroid nodule as a PET avid subcarinal lymph node with SUV of 4.  Patient was referred from [facility], Dr. Lavera Guise for evaluation of endobronchial ultrasound and transbronchial needle aspirations.  Today in the office we discussed the risk, benefits and alternatives of proceeding with invasive diagnostic bronchoscopy to include endobronchial ultrasound with transbronchial needle aspirations of the station 7 lymph node.  Discussed the risk of bleeding as well as the potential for pneumothorax or pneumomediastinum.  Patient is agreeable to proceed with planned bronchoscopy.  She is not on any antiplatelets.  And new medications need to be specifically held for the procedure despite preoperative plans.  Plan: We will proceed with video bronchoscopy with endobronchial ultrasound and transbronchial needle aspirations of the PET avid station 7 lymph node. We will attempt to schedule this for next week at [facility] long hospital endoscopy. Tuesday is the patient and patient's family preferred date. As for her COPD she can continue to follow-up with her primary pulmonologist Dr. Carren Rang. Continue Trelegy plus as needed Ventolin.  Greater than 50% of this patient's 60-minute office visit was been face-to-face discussing above recommendations and treatment plan, review of invasive diagnostic procedure, risk benefits and alternatives discussed as well as review of patient's imaging.  Additionally time spent coordinating planned procedure.   Current Outpatient Medications:  .  albuterol (PROVENTIL HFA;VENTOLIN HFA) 108 (90 Base) MCG/ACT inhaler, Inhale 2 puffs into the lungs every 6 (six) hours as needed for wheezing or shortness of breath., Disp: , Rfl:  .  alendronate (FOSAMAX) 70 MG tablet, Take 70 mg by mouth once a week. On Sunday, Disp: , Rfl:  .  atorvastatin (LIPITOR) 80 MG tablet, Take 80 mg by mouth daily., Disp: , Rfl:  .  clonazePAM (KLONOPIN) 0.5 MG tablet, Take 0.5 mg by mouth 2 (two) times daily as needed., Disp: , Rfl:  .  doxepin (SINEQUAN) 50 MG capsule, Take 50 mg by mouth at bedtime., Disp: , Rfl:   .  DULoxetine (CYMBALTA) 60 MG capsule, Take 60 mg by mouth daily., Disp: , Rfl:  .  famotidine (PEPCID) 20 MG tablet, Take 20 mg by mouth 2 (two) times daily., Disp: , Rfl:  .  gabapentin (NEURONTIN) 400 MG capsule, Take 2 capsules (800 mg total) by mouth 3 (three) times daily., Disp: 60 capsule, Rfl: 0 .  hydrOXYzine (ATARAX/VISTARIL) 25 MG tablet, Take by mouth., Disp: , Rfl:  .  ipratropium-albuterol (DUONEB) 0.5-2.5 (3) MG/3ML SOLN, , Disp: , Rfl:  .  LANTUS SOLOSTAR 100 UNIT/ML Solostar Pen, Inject 50 Units into the skin at bedtime., Disp: , Rfl:  .  lisinopril (ZESTRIL) 5 MG tablet, Take 5 mg by mouth daily., Disp: , Rfl:  .  LORazepam (ATIVAN) 0.5 MG tablet, Take 0.5 mg by mouth at bedtime. , Disp: , Rfl:  .  meloxicam (MOBIC) 15 MG tablet, Take 15 mg by mouth daily., Disp: , Rfl:  .  NOVOLOG FLEXPEN 100 UNIT/ML FlexPen, , Disp: , Rfl:  .  omeprazole (PRILOSEC)  20 MG capsule, Take 20 mg by mouth daily., Disp: , Rfl:  .  OXcarbazepine (TRILEPTAL) 150 MG tablet, Take 1 tablet (150 mg total) by mouth 2 (two) times daily., Disp: 60 tablet, Rfl: 0 .  ranitidine (ZANTAC) 150 MG tablet, Take 150 mg by mouth 2 (two) times daily., Disp: , Rfl:  .  risperiDONE (RISPERDAL) 2 MG tablet, , Disp: , Rfl:  .  TRELEGY ELLIPTA 100-62.5-25 MCG/INH AEPB, , Disp: , Rfl:  .  TRULICITY 1.5 JW/7.1GR SOPN, , Disp: , Rfl:  .  Vitamin D, Ergocalciferol, (DRISDOL) 50000 units CAPS capsule, Take 50,000 Units by mouth every 7 (seven) days. On Sunday, Disp: , Rfl:    Garner Nash, DO  Pulmonary Critical Care 11/22/2019 11:10 AM

## 2019-11-22 NOTE — Patient Instructions (Addendum)
Thank you for visiting Dr. Valeta Harms at [facility]. Today we recommend the following: Orders Placed This Encounter  Procedures  . CBC  . Comp Met (CMET)  . Ambulatory referral to Pulmonology   Follow up after bronchoscopy.   Return in about 3 weeks (around 12/13/2019) for with APP or Dr. Valeta Harms.    Please do your part to reduce the spread of COVID-19.

## 2019-11-22 NOTE — Addendum Note (Signed)
Addended by: Suzzanne Cloud E on: 11/22/2019 11:39 AM   Modules accepted: Orders

## 2019-11-22 NOTE — Progress Notes (Signed)
Synopsis: Referred in December 2020 for abnormal CT chest by Marice Potter, MD  Subjective:   PATIENT ID: Angela Kane GENDER: female DOB: October 13, 1961, MRN: [id number]  Chief Complaint  Patient presents with  . Consult    Consult for possible biopsy. Reports she has thyroid cancer and wants to be sure it has not spread to her lungs. She reports a productive with white mucous.    PMH smoker seen by Dr. Bobby Rumpf at [facility]. She was found to have enlarged PET Avid adenopathy within the chest. She was referred here for evaluation for bronchoscopy EBUS and nodal sampling. Follows with Dr. Alcide Clever in [place] as well for her COPD. Currently taking trelegy and ventolin. Smoking since age 11 years, 1ppd.  Today we reviewed her PET scan imaging which reveals a SUV 4 subcarinal lymph node.  She unfortunately is still smoking.  She lives alone.  She smokes approximately 1 pack/day.  Patient denies hemoptysis, fevers, night sweats, weight loss.   Past Medical History:  Diagnosis Date  . History of degenerative disc disease      Family History  Problem Relation Age of Onset  . Heart disease Mother   . Diabetes Mother   . Diabetes Father   . Heart disease Father      Past Surgical History:  Procedure Laterality Date  . ABDOMINAL HYSTERECTOMY    . CHOLECYSTECTOMY      Social History   Socioeconomic History  . Marital status: Single    Spouse name: Not on file  . Number of children: Not on file  . Years of education: Not on file  . Highest education level: Not on file  Occupational History  . Not on file  Social Needs  . Financial resource strain: Not on file  . Food insecurity    Worry: Not on file    Inability: Not on file  . Transportation needs    Medical: Not on file    Non-medical: Not on file  Tobacco Use  . Smoking status: Current Every Day Smoker    Packs/day: 2.00  . Smokeless tobacco: Never Used  . Tobacco comment: smokes pack per day  12.8.2020  Substance and Sexual Activity  . Alcohol use: No    Frequency: Never  . Drug use: No  . Sexual activity: Not on file  Lifestyle  . Physical activity    Days per week: Not on file    Minutes per session: Not on file  . Stress: Not on file  Relationships  . Social Herbalist on phone: Not on file    Gets together: Not on file    Attends religious service: Not on file    Active member of club or organization: Not on file    Attends meetings of clubs or organizations: Not on file    Relationship status: Not on file  . Intimate partner violence    Fear of current or ex partner: Not on file    Emotionally abused: Not on file    Physically abused: Not on file    Forced sexual activity: Not on file  Other Topics Concern  . Not on file  Social History Narrative  . Not on file     Allergies  Allergen Reactions  . Amaranth (Fd&C Red #2) Hypertension  . Metrizamide Hypertension    IV DYE  . Betadine [Povidone Iodine] Itching    "Makes the inside of my mouth itch, makes me itch  internally, and it makes me want to scratch myself raw "  . Latex Itching  . Penicillins     Has patient had a PCN reaction causing immediate rash, facial/tongue/throat swelling, SOB or lightheadedness with hypotension: Yes Has patient had a PCN reaction causing severe rash involving mucus membranes or skin necrosis: Unknown Has patient had a PCN reaction that required hospitalization: Unknown Has patient had a PCN reaction occurring within the last 10 years:No If all of the above answers are "NO", then may proceed with Cephalosporin use.  **Childhood alergy     Outpatient Medications Prior to Visit  Medication Sig Dispense Refill  . albuterol (PROVENTIL HFA;VENTOLIN HFA) 108 (90 Base) MCG/ACT inhaler Inhale 2 puffs into the lungs every 6 (six) hours as needed for wheezing or shortness of breath.    Marland Kitchen alendronate (FOSAMAX) 70 MG tablet Take 70 mg by mouth once a week. On Sunday    .  atorvastatin (LIPITOR) 80 MG tablet Take 80 mg by mouth daily.    . clonazePAM (KLONOPIN) 0.5 MG tablet Take 0.5 mg by mouth 2 (two) times daily as needed.    . doxepin (SINEQUAN) 50 MG capsule Take 50 mg by mouth at bedtime.    . DULoxetine (CYMBALTA) 60 MG capsule Take 60 mg by mouth daily.    . famotidine (PEPCID) 20 MG tablet Take 20 mg by mouth 2 (two) times daily.    Marland Kitchen gabapentin (NEURONTIN) 400 MG capsule Take 2 capsules (800 mg total) by mouth 3 (three) times daily. 60 capsule 0  . hydrOXYzine (ATARAX/VISTARIL) 25 MG tablet Take by mouth.    Marland Kitchen ipratropium-albuterol (DUONEB) 0.5-2.5 (3) MG/3ML SOLN     . LANTUS SOLOSTAR 100 UNIT/ML Solostar Pen Inject 50 Units into the skin at bedtime.    Marland Kitchen lisinopril (ZESTRIL) 5 MG tablet Take 5 mg by mouth daily.    Marland Kitchen LORazepam (ATIVAN) 0.5 MG tablet Take 0.5 mg by mouth at bedtime.     . meloxicam (MOBIC) 15 MG tablet Take 15 mg by mouth daily.    Marland Kitchen NOVOLOG FLEXPEN 100 UNIT/ML FlexPen     . omeprazole (PRILOSEC) 20 MG capsule Take 20 mg by mouth daily.    . OXcarbazepine (TRILEPTAL) 150 MG tablet Take 1 tablet (150 mg total) by mouth 2 (two) times daily. 60 tablet 0  . ranitidine (ZANTAC) 150 MG tablet Take 150 mg by mouth 2 (two) times daily.    . risperiDONE (RISPERDAL) 2 MG tablet     . TRELEGY ELLIPTA 100-62.5-25 MCG/INH AEPB     . TRULICITY 1.5 KA/7.6OT SOPN     . Vitamin D, Ergocalciferol, (DRISDOL) 50000 units CAPS capsule Take 50,000 Units by mouth every 7 (seven) days. On Sunday     No facility-administered medications prior to visit.     Review of Systems  Constitutional: Negative for chills, fever, malaise/fatigue and weight loss.  HENT: Negative for hearing loss, sore throat and tinnitus.   Eyes: Negative for blurred vision and double vision.  Respiratory: Positive for cough, sputum production and shortness of breath. Negative for hemoptysis, wheezing and stridor.   Cardiovascular: Negative for chest pain, palpitations, orthopnea,  leg swelling and PND.  Gastrointestinal: Negative for abdominal pain, constipation, diarrhea, heartburn, nausea and vomiting.  Genitourinary: Negative for dysuria, hematuria and urgency.  Musculoskeletal: Negative for joint pain and myalgias.  Skin: Negative for itching and rash.  Neurological: Negative for dizziness, tingling, weakness and headaches.  Endo/Heme/Allergies: Negative for environmental allergies. Does not bruise/bleed easily.  Psychiatric/Behavioral: Negative for depression. The patient is not nervous/anxious and does not have insomnia.   All other systems reviewed and are negative.    Objective:  Physical Exam Vitals signs reviewed.  Constitutional:      General: She is not in acute distress.    Appearance: She is well-developed.  HENT:     Head: Normocephalic and atraumatic.     Mouth/Throat:     Pharynx: No oropharyngeal exudate.  Eyes:     Conjunctiva/sclera: Conjunctivae normal.     Pupils: Pupils are equal, round, and reactive to light.  Neck:     Vascular: No JVD.     Trachea: No tracheal deviation.     Comments: Loss of supraclavicular fat Cardiovascular:     Rate and Rhythm: Normal rate and regular rhythm.     Heart sounds: S1 normal and S2 normal.     Comments: Distant heart tones Pulmonary:     Effort: No tachypnea or accessory muscle usage.     Breath sounds: No stridor. Decreased breath sounds (throughout all lung fields) present. No wheezing, rhonchi or rales.     Comments: Diminished breath sounds bilaterally Abdominal:     General: Bowel sounds are normal. There is no distension.     Palpations: Abdomen is soft.     Tenderness: There is no abdominal tenderness.  Musculoskeletal:        General: Deformity (muscle wasting ) present.  Skin:    General: Skin is warm and dry.     Capillary Refill: Capillary refill takes less than 2 seconds.     Findings: No rash.  Neurological:     Mental Status: She is alert and oriented to person, place, and  time.  Psychiatric:        Behavior: Behavior normal.      Vitals:   11/22/19 1103  BP: 122/78  Pulse: 95  Temp: 98 F (36.7 C)  TempSrc: Temporal  SpO2: 96%  Weight: 110 lb 12.8 oz (50.3 kg)  Height: '5\' 1"'  (1.549 m)   96% on RA BMI Readings from Last 3 Encounters:  11/22/19 20.94 kg/m  02/05/18 25.61 kg/m  01/26/18 25.61 kg/m   Wt Readings from Last 3 Encounters:  11/22/19 110 lb 12.8 oz (50.3 kg)  02/05/18 140 lb (63.5 kg)  01/26/18 140 lb (63.5 kg)     CBC    Component Value Date/Time   WBC 15.9 (H) 02/05/2018 2059   RBC 5.90 (H) 02/05/2018 2059   HGB 18.3 (H) 02/05/2018 2059   HCT 53.4 (H) 02/05/2018 2059   PLT 377 02/05/2018 2059   MCV 90.5 02/05/2018 2059   MCH 31.0 02/05/2018 2059   MCHC 34.3 02/05/2018 2059   RDW 14.2 02/05/2018 2059   LYMPHSABS 5.0 (H) 02/05/2018 2059   MONOABS 0.8 02/05/2018 2059   EOSABS 0.2 02/05/2018 2059   BASOSABS 0.1 02/05/2018 2059     Chest Imaging:  PET Imaging - Able to view in PACS 1.9 cm, SUV 4.2, subcarinal node   Pulmonary Functions Testing Results: No flowsheet data found.  FeNO: None   Pathology: None  Echocardiogram: None  Heart Catheterization: None     Assessment & Plan:     ICD-10-CM   1. Mediastinal adenopathy  R59.0 Ambulatory referral to Pulmonology    CBC    Comp Met (CMET)  2. Smoker  F17.200 Ambulatory referral to Pulmonology    CBC    Comp Met (CMET)  3. Abnormal PET scan, lung  R94.2  Ambulatory referral to Pulmonology    CBC    Comp Met (CMET)  4. Pre-op evaluation  Z01.818 CBC    Comp Met (CMET)  5. Cough  R05 CBC    Comp Met (CMET)  6. At risk for bleeding  Z91.89 CBC    Comp Met (CMET)    Discussion:  This 58 year old female significant past medical history of tobacco abuse, smoked since the age of 5.  Recent PET scan imaging with PET avid thyroid nodule as a PET avid subcarinal lymph node with SUV of 4.  Patient was referred from [facility], Dr. Lavera Guise for evaluation of endobronchial ultrasound and transbronchial needle aspirations.  Today in the office we discussed the risk, benefits and alternatives of proceeding with invasive diagnostic bronchoscopy to include endobronchial ultrasound with transbronchial needle aspirations of the station 7 lymph node.  Discussed the risk of bleeding as well as the potential for pneumothorax or pneumomediastinum.  Patient is agreeable to proceed with planned bronchoscopy.  She is not on any antiplatelets.  And new medications need to be specifically held for the procedure despite preoperative plans.  Plan: We will proceed with video bronchoscopy with endobronchial ultrasound and transbronchial needle aspirations of the PET avid station 7 lymph node. We will attempt to schedule this for next week at [facility] long hospital endoscopy. Tuesday is the patient and patient's family preferred date. As for her COPD she can continue to follow-up with her primary pulmonologist Dr. Carren Rang. Continue Trelegy plus as needed Ventolin.  Greater than 50% of this patient's 60-minute office visit was been face-to-face discussing above recommendations and treatment plan, review of invasive diagnostic procedure, risk benefits and alternatives discussed as well as review of patient's imaging.  Additionally time spent coordinating planned procedure.   Current Outpatient Medications:  .  albuterol (PROVENTIL HFA;VENTOLIN HFA) 108 (90 Base) MCG/ACT inhaler, Inhale 2 puffs into the lungs every 6 (six) hours as needed for wheezing or shortness of breath., Disp: , Rfl:  .  alendronate (FOSAMAX) 70 MG tablet, Take 70 mg by mouth once a week. On Sunday, Disp: , Rfl:  .  atorvastatin (LIPITOR) 80 MG tablet, Take 80 mg by mouth daily., Disp: , Rfl:  .  clonazePAM (KLONOPIN) 0.5 MG tablet, Take 0.5 mg by mouth 2 (two) times daily as needed., Disp: , Rfl:  .  doxepin (SINEQUAN) 50 MG capsule, Take 50 mg by mouth at bedtime., Disp: , Rfl:   .  DULoxetine (CYMBALTA) 60 MG capsule, Take 60 mg by mouth daily., Disp: , Rfl:  .  famotidine (PEPCID) 20 MG tablet, Take 20 mg by mouth 2 (two) times daily., Disp: , Rfl:  .  gabapentin (NEURONTIN) 400 MG capsule, Take 2 capsules (800 mg total) by mouth 3 (three) times daily., Disp: 60 capsule, Rfl: 0 .  hydrOXYzine (ATARAX/VISTARIL) 25 MG tablet, Take by mouth., Disp: , Rfl:  .  ipratropium-albuterol (DUONEB) 0.5-2.5 (3) MG/3ML SOLN, , Disp: , Rfl:  .  LANTUS SOLOSTAR 100 UNIT/ML Solostar Pen, Inject 50 Units into the skin at bedtime., Disp: , Rfl:  .  lisinopril (ZESTRIL) 5 MG tablet, Take 5 mg by mouth daily., Disp: , Rfl:  .  LORazepam (ATIVAN) 0.5 MG tablet, Take 0.5 mg by mouth at bedtime. , Disp: , Rfl:  .  meloxicam (MOBIC) 15 MG tablet, Take 15 mg by mouth daily., Disp: , Rfl:  .  NOVOLOG FLEXPEN 100 UNIT/ML FlexPen, , Disp: , Rfl:  .  omeprazole (PRILOSEC)  20 MG capsule, Take 20 mg by mouth daily., Disp: , Rfl:  .  OXcarbazepine (TRILEPTAL) 150 MG tablet, Take 1 tablet (150 mg total) by mouth 2 (two) times daily., Disp: 60 tablet, Rfl: 0 .  ranitidine (ZANTAC) 150 MG tablet, Take 150 mg by mouth 2 (two) times daily., Disp: , Rfl:  .  risperiDONE (RISPERDAL) 2 MG tablet, , Disp: , Rfl:  .  TRELEGY ELLIPTA 100-62.5-25 MCG/INH AEPB, , Disp: , Rfl:  .  TRULICITY 1.5 RF/7.5OI SOPN, , Disp: , Rfl:  .  Vitamin D, Ergocalciferol, (DRISDOL) 50000 units CAPS capsule, Take 50,000 Units by mouth every 7 (seven) days. On Sunday, Disp: , Rfl:    Garner Nash, DO [facility] 11/22/2019 11:10 AM

## 2019-11-26 ENCOUNTER — Other Ambulatory Visit (HOSPITAL_COMMUNITY)
Admission: RE | Admit: 2019-11-26 | Discharge: 2019-11-26 | Disposition: A | Discharge status: Home / Self Care | Payer: Medicare Other | Source: Ambulatory Visit | Attending: Pulmonary Disease | Admitting: Pulmonary Disease

## 2019-11-26 DIAGNOSIS — Z20828 Contact with and (suspected) exposure to other viral communicable diseases: Secondary | ICD-10-CM | POA: Insufficient documentation

## 2019-11-26 DIAGNOSIS — Z01812 Encounter for preprocedural laboratory examination: Secondary | ICD-10-CM | POA: Insufficient documentation

## 2019-11-27 LAB — NOVEL CORONAVIRUS, NAA (HOSP ORDER, SEND-OUT TO REF LAB; TAT 18-24 HRS): SARS-CoV-2, NAA: NOT DETECTED

## 2019-11-28 ENCOUNTER — Other Ambulatory Visit: Payer: Self-pay

## 2019-11-28 ENCOUNTER — Encounter (HOSPITAL_COMMUNITY): Payer: Self-pay | Admitting: Pulmonary Disease

## 2019-11-30 ENCOUNTER — Ambulatory Visit (HOSPITAL_COMMUNITY)
Admission: RE | Admit: 2019-11-30 | Discharge: 2019-11-30 | Disposition: A | Discharge status: Home / Self Care | Payer: Medicare Other | Source: Ambulatory Visit | Attending: Pulmonary Disease | Admitting: Pulmonary Disease

## 2019-11-30 ENCOUNTER — Ambulatory Visit (HOSPITAL_COMMUNITY)
Admission: RE | Admit: 2019-11-30 | Discharge: 2019-11-30 | Disposition: A | Discharge status: Home / Self Care | Payer: Medicare Other | Attending: Pulmonary Disease | Admitting: Pulmonary Disease

## 2019-11-30 ENCOUNTER — Ambulatory Visit (HOSPITAL_COMMUNITY): Payer: Medicare Other | Admitting: Anesthesiology

## 2019-11-30 ENCOUNTER — Encounter (HOSPITAL_COMMUNITY)
Admission: RE | Disposition: A | Discharge status: Home / Self Care | Payer: Self-pay | Source: Home / Self Care | Attending: Pulmonary Disease

## 2019-11-30 ENCOUNTER — Other Ambulatory Visit: Payer: Self-pay

## 2019-11-30 DIAGNOSIS — Z833 Family history of diabetes mellitus: Secondary | ICD-10-CM | POA: Insufficient documentation

## 2019-11-30 DIAGNOSIS — F209 Schizophrenia, unspecified: Secondary | ICD-10-CM | POA: Insufficient documentation

## 2019-11-30 DIAGNOSIS — C73 Malignant neoplasm of thyroid gland: Secondary | ICD-10-CM | POA: Diagnosis not present

## 2019-11-30 DIAGNOSIS — Z794 Long term (current) use of insulin: Secondary | ICD-10-CM | POA: Insufficient documentation

## 2019-11-30 DIAGNOSIS — Z90710 Acquired absence of both cervix and uterus: Secondary | ICD-10-CM | POA: Insufficient documentation

## 2019-11-30 DIAGNOSIS — Z79899 Other long term (current) drug therapy: Secondary | ICD-10-CM | POA: Diagnosis not present

## 2019-11-30 DIAGNOSIS — C969 Malignant neoplasm of lymphoid, hematopoietic and related tissue, unspecified: Secondary | ICD-10-CM | POA: Diagnosis not present

## 2019-11-30 DIAGNOSIS — R942 Abnormal results of pulmonary function studies: Secondary | ICD-10-CM | POA: Diagnosis present

## 2019-11-30 DIAGNOSIS — Z91040 Latex allergy status: Secondary | ICD-10-CM | POA: Diagnosis not present

## 2019-11-30 DIAGNOSIS — F329 Major depressive disorder, single episode, unspecified: Secondary | ICD-10-CM | POA: Diagnosis not present

## 2019-11-30 DIAGNOSIS — Z880 Allergy status to penicillin: Secondary | ICD-10-CM | POA: Diagnosis not present

## 2019-11-30 DIAGNOSIS — Z888 Allergy status to other drugs, medicaments and biological substances status: Secondary | ICD-10-CM | POA: Diagnosis not present

## 2019-11-30 DIAGNOSIS — R05 Cough: Secondary | ICD-10-CM

## 2019-11-30 DIAGNOSIS — K219 Gastro-esophageal reflux disease without esophagitis: Secondary | ICD-10-CM | POA: Diagnosis not present

## 2019-11-30 DIAGNOSIS — C781 Secondary malignant neoplasm of mediastinum: Secondary | ICD-10-CM | POA: Insufficient documentation

## 2019-11-30 DIAGNOSIS — F419 Anxiety disorder, unspecified: Secondary | ICD-10-CM | POA: Diagnosis not present

## 2019-11-30 DIAGNOSIS — Z9049 Acquired absence of other specified parts of digestive tract: Secondary | ICD-10-CM | POA: Insufficient documentation

## 2019-11-30 DIAGNOSIS — R590 Localized enlarged lymph nodes: Secondary | ICD-10-CM | POA: Diagnosis present

## 2019-11-30 DIAGNOSIS — J449 Chronic obstructive pulmonary disease, unspecified: Secondary | ICD-10-CM | POA: Diagnosis not present

## 2019-11-30 DIAGNOSIS — Z791 Long term (current) use of non-steroidal anti-inflammatories (NSAID): Secondary | ICD-10-CM | POA: Diagnosis not present

## 2019-11-30 DIAGNOSIS — F17200 Nicotine dependence, unspecified, uncomplicated: Secondary | ICD-10-CM | POA: Diagnosis not present

## 2019-11-30 DIAGNOSIS — E119 Type 2 diabetes mellitus without complications: Secondary | ICD-10-CM | POA: Diagnosis not present

## 2019-11-30 DIAGNOSIS — Z8249 Family history of ischemic heart disease and other diseases of the circulatory system: Secondary | ICD-10-CM | POA: Insufficient documentation

## 2019-11-30 HISTORY — DX: Anxiety disorder, unspecified: F41.9

## 2019-11-30 HISTORY — DX: Emphysema, unspecified: J43.9

## 2019-11-30 HISTORY — DX: Unspecified asthma, uncomplicated: J45.909

## 2019-11-30 HISTORY — PX: FLEXIBLE BRONCHOSCOPY: SHX5094

## 2019-11-30 HISTORY — DX: Malignant (primary) neoplasm, unspecified: C80.1

## 2019-11-30 HISTORY — DX: Depression, unspecified: F32.A

## 2019-11-30 HISTORY — PX: BRONCHIAL NEEDLE ASPIRATION BIOPSY: SHX5106

## 2019-11-30 HISTORY — DX: Type 2 diabetes mellitus without complications: E11.9

## 2019-11-30 HISTORY — PX: ENDOBRONCHIAL ULTRASOUND: SHX5096

## 2019-11-30 LAB — COMPREHENSIVE METABOLIC PANEL
ALT: 44 U/L (ref 0–44)
AST: 37 U/L (ref 15–41)
Albumin: 3.6 g/dL (ref 3.5–5.0)
Alkaline Phosphatase: 88 U/L (ref 38–126)
Anion gap: 11 (ref 5–15)
BUN: <5 mg/dL — ABNORMAL LOW (ref 6–20)
CO2: 22 mmol/L (ref 22–32)
Calcium: 8.9 mg/dL (ref 8.9–10.3)
Chloride: 102 mmol/L (ref 98–111)
Creatinine, Ser: 0.81 mg/dL (ref 0.44–1.00)
GFR calc Af Amer: >60 mL/min (ref >60)
GFR calc non Af Amer: >60 mL/min (ref >60)
Glucose, Bld: 193 mg/dL — ABNORMAL HIGH (ref 70–99)
Potassium: 3.6 mmol/L (ref 3.5–5.1)
Sodium: 135 mmol/L (ref 135–145)
Total Bilirubin: 0.6 mg/dL (ref 0.3–1.2)
Total Protein: 7.2 g/dL (ref 6.5–8.1)

## 2019-11-30 LAB — CBC
HCT: 46 % (ref 36.0–46.0)
Hemoglobin: 14.5 g/dL (ref 12.0–15.0)
MCH: 29.3 pg (ref 26.0–34.0)
MCHC: 31.5 g/dL (ref 30.0–36.0)
MCV: 92.9 fL (ref 80.0–100.0)
Platelets: 456 10*3/uL — ABNORMAL HIGH (ref 150–400)
RBC: 4.95 MIL/uL (ref 3.87–5.11)
RDW: 13.9 % (ref 11.5–15.5)
WBC: 11.9 10*3/uL — ABNORMAL HIGH (ref 4.0–10.5)
nRBC: 0 % (ref 0.0–0.2)

## 2019-11-30 LAB — APTT: aPTT: 28 seconds (ref 24–36)

## 2019-11-30 LAB — PROTIME-INR
INR: 0.9 (ref 0.8–1.2)
Prothrombin Time: 12.3 seconds (ref 11.4–15.2)

## 2019-11-30 SURGERY — ENDOBRONCHIAL ULTRASOUND (EBUS)
Anesthesia: General | Laterality: Bilateral

## 2019-11-30 MED ORDER — FENTANYL CITRATE (PF) 250 MCG/5ML IJ SOLN
INTRAMUSCULAR | Status: DC | PRN
  Administered 2019-11-30 (×2): 50 ug via INTRAVENOUS

## 2019-11-30 MED ORDER — LIDOCAINE 2% (20 MG/ML) 5 ML SYRINGE
INTRAMUSCULAR | Status: DC | PRN
  Administered 2019-11-30: 80 mg via INTRAVENOUS

## 2019-11-30 MED ORDER — PROPOFOL 10 MG/ML IV BOLUS
INTRAVENOUS | Status: DC | PRN
  Administered 2019-11-30: 150 mg via INTRAVENOUS

## 2019-11-30 MED ORDER — PHENYLEPHRINE HCL (PRESSORS) 10 MG/ML IV SOLN
INTRAVENOUS | Status: DC | PRN
  Administered 2019-11-30: 80 ug via INTRAVENOUS

## 2019-11-30 MED ORDER — MIDAZOLAM HCL 5 MG/5ML IJ SOLN
INTRAMUSCULAR | Status: DC | PRN
  Administered 2019-11-30: 2 mg via INTRAVENOUS

## 2019-11-30 MED ORDER — LACTATED RINGERS IV SOLN: INTRAVENOUS | Status: DC

## 2019-11-30 MED ORDER — PHENYLEPHRINE 40 MCG/ML (10ML) SYRINGE FOR IV PUSH (FOR BLOOD PRESSURE SUPPORT)
PREFILLED_SYRINGE | INTRAVENOUS | Status: DC | PRN
  Administered 2019-11-30 (×6): 80 ug via INTRAVENOUS

## 2019-11-30 MED ORDER — SUGAMMADEX SODIUM 200 MG/2ML IV SOLN
INTRAVENOUS | Status: DC | PRN
  Administered 2019-11-30: 300 mg via INTRAVENOUS

## 2019-11-30 MED ORDER — DEXAMETHASONE SODIUM PHOSPHATE 10 MG/ML IJ SOLN
INTRAMUSCULAR | Status: DC | PRN
  Administered 2019-11-30: 10 mg via INTRAVENOUS

## 2019-11-30 MED ORDER — ONDANSETRON HCL 4 MG/2ML IJ SOLN
INTRAMUSCULAR | Status: DC | PRN
  Administered 2019-11-30: 4 mg via INTRAVENOUS

## 2019-11-30 MED ORDER — FENTANYL CITRATE (PF) 100 MCG/2ML IJ SOLN
INTRAMUSCULAR | Status: AC
  Filled 2019-11-30: qty 2

## 2019-11-30 MED ORDER — SUCCINYLCHOLINE CHLORIDE 200 MG/10ML IV SOSY
PREFILLED_SYRINGE | INTRAVENOUS | Status: DC | PRN
  Administered 2019-11-30: 80 mg via INTRAVENOUS

## 2019-11-30 MED ORDER — ALBUTEROL SULFATE HFA 108 (90 BASE) MCG/ACT IN AERS
INHALATION_SPRAY | RESPIRATORY_TRACT | Status: DC | PRN
  Administered 2019-11-30: 4 via RESPIRATORY_TRACT

## 2019-11-30 MED ORDER — ROCURONIUM BROMIDE 10 MG/ML (PF) SYRINGE
PREFILLED_SYRINGE | INTRAVENOUS | Status: DC | PRN
  Administered 2019-11-30: 50 mg via INTRAVENOUS

## 2019-11-30 MED ORDER — MIDAZOLAM HCL 2 MG/2ML IJ SOLN
INTRAMUSCULAR | Status: AC
  Filled 2019-11-30: qty 2

## 2019-11-30 MED ORDER — LACTATED RINGERS IV SOLN: INTRAVENOUS | Status: DC | PRN

## 2019-11-30 NOTE — Op Note (Signed)
Video Bronchoscopy with Endobronchial Ultrasound Procedure Note  Date of Operation: 11/30/2019   Pre-op Diagnosis: Mediastinal adenopathy  Post-op Diagnosis: Mediastinal adenopathy  Surgeon: Garner Nash, DO   Assistants: None   Anesthesia: General endotracheal anesthesia  Operation: Flexible video fiberoptic bronchoscopy with endobronchial ultrasound and biopsies.  Estimated Blood Loss: Minimal, <6PY   Complications: None   Indications and History: Angela Kane is a 58 y.o. female with PET avid mediastinal adenopathy, lower level station 7 PET avid lesion..  The risks, benefits, complications, treatment options and expected outcomes were discussed with the patient.  The possibilities of pneumothorax, pneumonia, reaction to medication, pulmonary aspiration, perforation of a viscus, bleeding, failure to diagnose a condition and creating a complication requiring transfusion or operation were discussed with the patient who freely signed the consent.    Description of Procedure: The patient was examined in the preoperative area and history and data from the preprocedure consultation were reviewed. It was deemed appropriate to proceed.  The patient was taken to [facility] long endoscopy room 1, identified as Angela Kane and the procedure verified as Flexible Video Fiberoptic Bronchoscopy.  A Time Out was held and the above information confirmed. After being taken to the operating room general anesthesia was initiated and the patient  was orally intubated. The video fiberoptic bronchoscope was introduced via the endotracheal tube and a general inspection was performed which showed normal right and left lung anatomy, distal bronchiectatic openings pitting and striations consistent with smoking history. The standard scope was then withdrawn and the endobronchial ultrasound was used to identify and characterize the peritracheal, hilar and bronchial lymph nodes. Inspection showed no  significant adenopathy except enlarged lower right sided medial station 7 mass greater than 3 cm in largest ultrasound cross-section. Using real-time ultrasound guidance Wang needle biopsies were take from Station 7 node/mass and were sent for cytology. The patient tolerated the procedure well without apparent complications. There was no significant blood loss. The bronchoscope was withdrawn. Anesthesia was reversed and the patient was taken to the PACU for recovery.   Samples: 1.  Transbronchial needle aspiration biopsies of station 7, slides plus cellblock  Plans:  The patient will be discharged from the PACU to home when recovered from anesthesia. We will review the cytology, pathology and microbiology results with the patient when they become available. Outpatient followup will be with Octavio Graves Moritz Lever, DO.   Preliminary pathology: Read by Dr. Tresa Moore adequate for malignancy diagnosis  Garner Nash, DO [facility] 11/30/2019 1:31 PM

## 2019-11-30 NOTE — Anesthesia Postprocedure Evaluation (Signed)
Anesthesia Post Note  Patient: Angela Kane  Procedure(s) Performed: ENDOBRONCHIAL ULTRASOUND (Bilateral )     Patient location during evaluation: PACU Anesthesia Type: General Level of consciousness: awake and alert, oriented and patient cooperative Pain management: pain level controlled Vital Signs Assessment: post-procedure vital signs reviewed and stable Respiratory status: spontaneous breathing, nonlabored ventilation and respiratory function stable Cardiovascular status: blood pressure returned to baseline and stable Postop Assessment: no apparent nausea or vomiting Anesthetic complications: no    Last Vitals:  Vitals:   11/30/19 1350 11/30/19 1400  BP: 101/71 100/65  Pulse: (!) 102 96  Resp: 15 16  Temp:    SpO2: 96% 90%    Last Pain:  Vitals:   11/30/19 1330  TempSrc: Temporal  PainSc: 0-No pain                 Pervis Hocking

## 2019-11-30 NOTE — Anesthesia Preprocedure Evaluation (Signed)
Anesthesia Evaluation  Patient identified by MRN, date of birth, ID band Patient awake    Reviewed: Allergy & Precautions, NPO status , Patient's Chart, lab work & pertinent test results  Airway Mallampati: II  TM Distance: >3 FB Neck ROM: Full    Dental no notable dental hx.    Pulmonary asthma , COPD, Current Smoker,  mediastinal adenopathy  Has been smoking since 58yo, still smoking 1ppd   Pulmonary exam normal breath sounds clear to auscultation       Cardiovascular Normal cardiovascular exam Rhythm:Regular Rate:Normal  Long QT   Neuro/Psych PSYCHIATRIC DISORDERS Anxiety Depression Schizophrenia negative neurological ROS     GI/Hepatic Neg liver ROS, GERD  ,  Endo/Other  negative endocrine ROSdiabetes, Type 2  Renal/GU negative Renal ROS  negative genitourinary   Musculoskeletal DDD   Abdominal Normal abdominal exam  (+)   Peds negative pediatric ROS (+)  Hematology  (+) Blood dyscrasia, , plt 540   Anesthesia Other Findings Hx encephalopathy, altered mental status, psychosis   Reproductive/Obstetrics negative OB ROS                             Anesthesia Physical Anesthesia Plan  ASA: III  Anesthesia Plan: General   Post-op Pain Management:    Induction: Intravenous and Rapid sequence  PONV Risk Score and Plan: 2 and Ondansetron, Dexamethasone, Treatment may vary due to age or medical condition and Midazolam  Airway Management Planned: Oral ETT  Additional Equipment: None  Intra-op Plan:   Post-operative Plan: Extubation in OR  Informed Consent: I have reviewed the patients History and Physical, chart, labs and discussed the procedure including the risks, benefits and alternatives for the proposed anesthesia with the patient or authorized representative who has indicated his/her understanding and acceptance.     Dental advisory given  Plan Discussed with:  CRNA  Anesthesia Plan Comments:         Anesthesia Quick Evaluation

## 2019-11-30 NOTE — Transfer of Care (Signed)
Immediate Anesthesia Transfer of Care Note  Patient: Angela Kane  Procedure(s) Performed: ENDOBRONCHIAL ULTRASOUND (Bilateral )  Patient Location: Endoscopy Unit  Anesthesia Type:General  Level of Consciousness: awake, alert , oriented and patient cooperative  Airway & Oxygen Therapy: Patient Spontanous Breathing and Patient connected to face mask oxygen  Post-op Assessment: Report given to RN, Post -op Vital signs reviewed and stable and Patient moving all extremities  Post vital signs: Reviewed and stable  Last Vitals:  Vitals Value Taken Time  BP 160/76 11/30/19 1330  Temp 36.5 C 11/30/19 1330  Pulse 106 11/30/19 1337  Resp 13 11/30/19 1337  SpO2 93 % 11/30/19 1337  Vitals shown include unvalidated device data.  Last Pain:  Vitals:   11/30/19 1330  TempSrc: Temporal  PainSc: 0-No pain         Complications: No apparent anesthesia complications

## 2019-11-30 NOTE — Progress Notes (Signed)
Pt SpO2 remains at 88%-91% on room air. I spoke with Dr. Doroteo Glassman about pt oxygen saturation. She said she was comfortable with the patient being discharged with her current vital signs

## 2019-11-30 NOTE — Anesthesia Procedure Notes (Signed)
Procedure Name: Intubation Date/Time: 11/30/2019 12:39 PM Performed by: Mitzie Na, CRNA Pre-anesthesia Checklist: Patient identified, Emergency Drugs available, Suction available and Patient being monitored Patient Re-evaluated:Patient Re-evaluated prior to induction Oxygen Delivery Method: Circle system utilized Preoxygenation: Pre-oxygenation with 100% oxygen Induction Type: Rapid sequence and IV induction Laryngoscope Size: Mac and 3 Grade View: Grade I Tube type: Oral Tube size: 8.5 mm Number of attempts: 1 Airway Equipment and Method: Stylet and Oral airway Placement Confirmation: ETT inserted through vocal cords under direct vision,  positive ETCO2 and breath sounds checked- equal and bilateral Secured at: 24 cm Tube secured with: Tape Dental Injury: Teeth and Oropharynx as per pre-operative assessment

## 2019-11-30 NOTE — Interval H&P Note (Signed)
History and Physical Interval Note:  11/30/2019 12:11 PM  Angela Kane  has presented today for surgery, with the diagnosis of Mediastinal adenopathy, abnormal pet scan.  The various methods of treatment have been discussed with the patient and family. After consideration of risks, benefits and other options for treatment, the patient has consented to  Procedure(s): ENDOBRONCHIAL ULTRASOUND (Bilateral) as a surgical intervention.  The patient's history has been reviewed, patient examined, no change in status, stable for surgery.  I have reviewed the patient's chart and labs.  Questions were answered to the patient's satisfaction.    Patient seen in pre-op. All questions answered. Discussed risks of bleeding and pneumothorax. No barriers to proceed.   [place]

## 2019-11-30 NOTE — Discharge Instructions (Signed)
Flexible Bronchoscopy, Care After This sheet gives you information about how to care for yourself after your test. Your doctor may also give you more specific instructions. If you have problems or questions, contact your doctor. Follow these instructions at home: Eating and drinking  Do not eat or drink anything (not even water) for 2 hours after your test, or until your numbing medicine (local anesthetic) wears off. Driving  Do not drive for 24 hours if you were given a medicine to help you relax (sedative).  Do not drive or use heavy machinery while taking prescription pain medicine. General instructions   Take over-the-counter and prescription medicines only as told by your doctor.  Return to your normal activities as told. Ask what activities are safe for you.  Do not use any products that have nicotine or tobacco in them. This includes cigarettes and e-cigarettes. If you need help quitting, ask your doctor.  Keep all follow-up visits as told by your doctor. This is important. It is very important if you had a tissue sample (biopsy) taken. Get help right away if:  You have shortness of breath that gets worse.  You get light-headed.  You feel like you are going to pass out (faint).  You have chest pain.  You cough up: ? More than a little blood. ? More blood than before. Summary  Do not eat or drink anything (not even water) for 2 hours after your test, or until your numbing medicine wears off.  Do not use cigarettes. Do not use e-cigarettes.  Get help right away if you have chest pain. This information is not intended to replace advice given to you by your health care provider. Make sure you discuss any questions you have with your health care provider. Document Released: 09/28/2009 Document Revised: 11/13/2017 Document Reviewed: 12/19/2016 Elsevier Patient Education  2020 [employer].

## 2019-12-01 ENCOUNTER — Encounter: Payer: Self-pay | Admitting: *Deleted

## 2019-12-02 LAB — CYTOLOGY - NON PAP

## 2019-12-06 DIAGNOSIS — D509 Iron deficiency anemia, unspecified: Secondary | ICD-10-CM | POA: Diagnosis not present

## 2019-12-08 DIAGNOSIS — D509 Iron deficiency anemia, unspecified: Secondary | ICD-10-CM | POA: Diagnosis not present

## 2019-12-12 DIAGNOSIS — C3481 Malignant neoplasm of overlapping sites of right bronchus and lung: Secondary | ICD-10-CM | POA: Diagnosis not present

## 2019-12-13 ENCOUNTER — Encounter: Payer: Self-pay | Admitting: Anesthesiology

## 2019-12-13 DIAGNOSIS — C3481 Malignant neoplasm of overlapping sites of right bronchus and lung: Secondary | ICD-10-CM | POA: Diagnosis not present

## 2019-12-13 NOTE — Addendum Note (Signed)
Addendum  created 12/13/19 1318 by Pervis Hocking, DO   Intraprocedure Event edited, Intraprocedure Staff edited

## 2019-12-14 ENCOUNTER — Ambulatory Visit: Payer: Medicare Other | Admitting: Primary Care

## 2019-12-14 DIAGNOSIS — Z452 Encounter for adjustment and management of vascular access device: Secondary | ICD-10-CM | POA: Diagnosis not present

## 2019-12-15 NOTE — Progress Notes (Signed)
Copy faxed via Epic routing to Dr Lavera Guise

## 2019-12-19 DIAGNOSIS — Z5111 Encounter for antineoplastic chemotherapy: Secondary | ICD-10-CM | POA: Diagnosis not present

## 2019-12-19 DIAGNOSIS — C3481 Malignant neoplasm of overlapping sites of right bronchus and lung: Secondary | ICD-10-CM | POA: Diagnosis not present

## 2019-12-20 DIAGNOSIS — Z510 Encounter for antineoplastic radiation therapy: Secondary | ICD-10-CM | POA: Diagnosis not present

## 2019-12-20 DIAGNOSIS — C3481 Malignant neoplasm of overlapping sites of right bronchus and lung: Secondary | ICD-10-CM | POA: Diagnosis not present

## 2019-12-20 DIAGNOSIS — Z5111 Encounter for antineoplastic chemotherapy: Secondary | ICD-10-CM | POA: Diagnosis not present

## 2019-12-21 DIAGNOSIS — C3481 Malignant neoplasm of overlapping sites of right bronchus and lung: Secondary | ICD-10-CM | POA: Diagnosis not present

## 2019-12-21 DIAGNOSIS — Z5111 Encounter for antineoplastic chemotherapy: Secondary | ICD-10-CM | POA: Diagnosis not present

## 2019-12-22 DIAGNOSIS — Z510 Encounter for antineoplastic radiation therapy: Secondary | ICD-10-CM | POA: Diagnosis not present

## 2019-12-22 DIAGNOSIS — C3481 Malignant neoplasm of overlapping sites of right bronchus and lung: Secondary | ICD-10-CM | POA: Diagnosis not present

## 2019-12-23 DIAGNOSIS — Z510 Encounter for antineoplastic radiation therapy: Secondary | ICD-10-CM | POA: Diagnosis not present

## 2019-12-23 DIAGNOSIS — C3481 Malignant neoplasm of overlapping sites of right bronchus and lung: Secondary | ICD-10-CM | POA: Diagnosis not present

## 2019-12-26 DIAGNOSIS — C3481 Malignant neoplasm of overlapping sites of right bronchus and lung: Secondary | ICD-10-CM | POA: Diagnosis not present

## 2019-12-26 DIAGNOSIS — E876 Hypokalemia: Secondary | ICD-10-CM | POA: Diagnosis not present

## 2019-12-27 DIAGNOSIS — C3481 Malignant neoplasm of overlapping sites of right bronchus and lung: Secondary | ICD-10-CM | POA: Diagnosis not present

## 2019-12-27 DIAGNOSIS — E876 Hypokalemia: Secondary | ICD-10-CM | POA: Diagnosis not present

## 2019-12-28 DIAGNOSIS — C3481 Malignant neoplasm of overlapping sites of right bronchus and lung: Secondary | ICD-10-CM | POA: Diagnosis not present

## 2019-12-28 DIAGNOSIS — Z510 Encounter for antineoplastic radiation therapy: Secondary | ICD-10-CM | POA: Diagnosis not present

## 2019-12-29 DIAGNOSIS — Z510 Encounter for antineoplastic radiation therapy: Secondary | ICD-10-CM | POA: Diagnosis not present

## 2019-12-29 DIAGNOSIS — D509 Iron deficiency anemia, unspecified: Secondary | ICD-10-CM | POA: Diagnosis not present

## 2019-12-29 DIAGNOSIS — C3481 Malignant neoplasm of overlapping sites of right bronchus and lung: Secondary | ICD-10-CM | POA: Diagnosis not present

## 2019-12-29 DIAGNOSIS — D473 Essential (hemorrhagic) thrombocythemia: Secondary | ICD-10-CM | POA: Diagnosis not present

## 2019-12-30 DIAGNOSIS — D473 Essential (hemorrhagic) thrombocythemia: Secondary | ICD-10-CM | POA: Diagnosis not present

## 2019-12-30 DIAGNOSIS — D509 Iron deficiency anemia, unspecified: Secondary | ICD-10-CM | POA: Diagnosis not present

## 2019-12-30 DIAGNOSIS — Z510 Encounter for antineoplastic radiation therapy: Secondary | ICD-10-CM | POA: Diagnosis not present

## 2019-12-30 DIAGNOSIS — C3481 Malignant neoplasm of overlapping sites of right bronchus and lung: Secondary | ICD-10-CM | POA: Diagnosis not present

## 2020-01-02 DIAGNOSIS — C3481 Malignant neoplasm of overlapping sites of right bronchus and lung: Secondary | ICD-10-CM | POA: Diagnosis not present

## 2020-01-02 DIAGNOSIS — E119 Type 2 diabetes mellitus without complications: Secondary | ICD-10-CM | POA: Diagnosis not present

## 2020-01-03 DIAGNOSIS — C3481 Malignant neoplasm of overlapping sites of right bronchus and lung: Secondary | ICD-10-CM | POA: Diagnosis not present

## 2020-01-04 DIAGNOSIS — Z510 Encounter for antineoplastic radiation therapy: Secondary | ICD-10-CM | POA: Diagnosis not present

## 2020-01-04 DIAGNOSIS — C3481 Malignant neoplasm of overlapping sites of right bronchus and lung: Secondary | ICD-10-CM | POA: Diagnosis not present

## 2020-01-05 DIAGNOSIS — C3481 Malignant neoplasm of overlapping sites of right bronchus and lung: Secondary | ICD-10-CM | POA: Diagnosis not present

## 2020-01-05 DIAGNOSIS — Z510 Encounter for antineoplastic radiation therapy: Secondary | ICD-10-CM | POA: Diagnosis not present

## 2020-01-06 DIAGNOSIS — F329 Major depressive disorder, single episode, unspecified: Secondary | ICD-10-CM | POA: Diagnosis not present

## 2020-01-06 DIAGNOSIS — F419 Anxiety disorder, unspecified: Secondary | ICD-10-CM | POA: Diagnosis not present

## 2020-01-06 DIAGNOSIS — C3490 Malignant neoplasm of unspecified part of unspecified bronchus or lung: Secondary | ICD-10-CM | POA: Diagnosis not present

## 2020-01-06 DIAGNOSIS — C3481 Malignant neoplasm of overlapping sites of right bronchus and lung: Secondary | ICD-10-CM | POA: Diagnosis not present

## 2020-01-06 DIAGNOSIS — Z510 Encounter for antineoplastic radiation therapy: Secondary | ICD-10-CM | POA: Diagnosis not present

## 2020-01-06 DIAGNOSIS — R1110 Vomiting, unspecified: Secondary | ICD-10-CM | POA: Diagnosis not present

## 2020-01-06 DIAGNOSIS — E079 Disorder of thyroid, unspecified: Secondary | ICD-10-CM | POA: Diagnosis not present

## 2020-01-06 DIAGNOSIS — E1169 Type 2 diabetes mellitus with other specified complication: Secondary | ICD-10-CM | POA: Diagnosis not present

## 2020-01-06 DIAGNOSIS — E876 Hypokalemia: Secondary | ICD-10-CM | POA: Diagnosis not present

## 2020-01-09 DIAGNOSIS — C3481 Malignant neoplasm of overlapping sites of right bronchus and lung: Secondary | ICD-10-CM | POA: Diagnosis not present

## 2020-01-09 DIAGNOSIS — Z510 Encounter for antineoplastic radiation therapy: Secondary | ICD-10-CM | POA: Diagnosis not present

## 2020-01-10 DIAGNOSIS — C3481 Malignant neoplasm of overlapping sites of right bronchus and lung: Secondary | ICD-10-CM | POA: Diagnosis not present

## 2020-01-10 DIAGNOSIS — E861 Hypovolemia: Secondary | ICD-10-CM | POA: Diagnosis not present

## 2020-01-11 DIAGNOSIS — D473 Essential (hemorrhagic) thrombocythemia: Secondary | ICD-10-CM | POA: Diagnosis not present

## 2020-01-11 DIAGNOSIS — C3481 Malignant neoplasm of overlapping sites of right bronchus and lung: Secondary | ICD-10-CM | POA: Diagnosis not present

## 2020-01-12 DIAGNOSIS — C3481 Malignant neoplasm of overlapping sites of right bronchus and lung: Secondary | ICD-10-CM | POA: Diagnosis not present

## 2020-01-12 DIAGNOSIS — Z5111 Encounter for antineoplastic chemotherapy: Secondary | ICD-10-CM | POA: Diagnosis not present

## 2020-01-13 DIAGNOSIS — C3481 Malignant neoplasm of overlapping sites of right bronchus and lung: Secondary | ICD-10-CM | POA: Diagnosis not present

## 2020-01-13 DIAGNOSIS — I480 Paroxysmal atrial fibrillation: Secondary | ICD-10-CM | POA: Diagnosis not present

## 2020-01-13 DIAGNOSIS — E785 Hyperlipidemia, unspecified: Secondary | ICD-10-CM | POA: Diagnosis not present

## 2020-01-13 DIAGNOSIS — Z510 Encounter for antineoplastic radiation therapy: Secondary | ICD-10-CM | POA: Diagnosis not present

## 2020-01-13 DIAGNOSIS — I10 Essential (primary) hypertension: Secondary | ICD-10-CM | POA: Diagnosis not present

## 2020-01-16 DIAGNOSIS — D473 Essential (hemorrhagic) thrombocythemia: Secondary | ICD-10-CM | POA: Diagnosis not present

## 2020-01-16 DIAGNOSIS — R739 Hyperglycemia, unspecified: Secondary | ICD-10-CM | POA: Diagnosis not present

## 2020-01-16 DIAGNOSIS — Z79899 Other long term (current) drug therapy: Secondary | ICD-10-CM | POA: Diagnosis not present

## 2020-01-16 DIAGNOSIS — C3481 Malignant neoplasm of overlapping sites of right bronchus and lung: Secondary | ICD-10-CM | POA: Diagnosis not present

## 2020-01-17 DIAGNOSIS — C3481 Malignant neoplasm of overlapping sites of right bronchus and lung: Secondary | ICD-10-CM | POA: Diagnosis not present

## 2020-01-17 DIAGNOSIS — Z510 Encounter for antineoplastic radiation therapy: Secondary | ICD-10-CM | POA: Diagnosis not present

## 2020-01-18 DIAGNOSIS — C3481 Malignant neoplasm of overlapping sites of right bronchus and lung: Secondary | ICD-10-CM | POA: Diagnosis not present

## 2020-01-18 DIAGNOSIS — Z510 Encounter for antineoplastic radiation therapy: Secondary | ICD-10-CM | POA: Diagnosis not present

## 2020-01-19 DIAGNOSIS — Z510 Encounter for antineoplastic radiation therapy: Secondary | ICD-10-CM | POA: Diagnosis not present

## 2020-01-19 DIAGNOSIS — D473 Essential (hemorrhagic) thrombocythemia: Secondary | ICD-10-CM | POA: Diagnosis not present

## 2020-01-19 DIAGNOSIS — C3481 Malignant neoplasm of overlapping sites of right bronchus and lung: Secondary | ICD-10-CM | POA: Diagnosis not present

## 2020-01-20 DIAGNOSIS — C3481 Malignant neoplasm of overlapping sites of right bronchus and lung: Secondary | ICD-10-CM | POA: Diagnosis not present

## 2020-01-20 DIAGNOSIS — Z510 Encounter for antineoplastic radiation therapy: Secondary | ICD-10-CM | POA: Diagnosis not present

## 2020-01-23 DIAGNOSIS — C3481 Malignant neoplasm of overlapping sites of right bronchus and lung: Secondary | ICD-10-CM | POA: Diagnosis not present

## 2020-01-24 DIAGNOSIS — C3481 Malignant neoplasm of overlapping sites of right bronchus and lung: Secondary | ICD-10-CM | POA: Diagnosis not present

## 2020-01-25 DIAGNOSIS — C3481 Malignant neoplasm of overlapping sites of right bronchus and lung: Secondary | ICD-10-CM | POA: Diagnosis not present

## 2020-01-25 DIAGNOSIS — Z510 Encounter for antineoplastic radiation therapy: Secondary | ICD-10-CM | POA: Diagnosis not present

## 2020-01-26 DIAGNOSIS — C3481 Malignant neoplasm of overlapping sites of right bronchus and lung: Secondary | ICD-10-CM | POA: Diagnosis not present

## 2020-01-26 DIAGNOSIS — Z510 Encounter for antineoplastic radiation therapy: Secondary | ICD-10-CM | POA: Diagnosis not present

## 2020-01-27 DIAGNOSIS — C3481 Malignant neoplasm of overlapping sites of right bronchus and lung: Secondary | ICD-10-CM | POA: Diagnosis not present

## 2020-01-27 DIAGNOSIS — Z510 Encounter for antineoplastic radiation therapy: Secondary | ICD-10-CM | POA: Diagnosis not present

## 2020-01-30 DIAGNOSIS — C3481 Malignant neoplasm of overlapping sites of right bronchus and lung: Secondary | ICD-10-CM

## 2020-01-30 DIAGNOSIS — Z79899 Other long term (current) drug therapy: Secondary | ICD-10-CM | POA: Diagnosis not present

## 2020-01-30 DIAGNOSIS — D509 Iron deficiency anemia, unspecified: Secondary | ICD-10-CM | POA: Diagnosis not present

## 2020-01-30 DIAGNOSIS — D473 Essential (hemorrhagic) thrombocythemia: Secondary | ICD-10-CM

## 2020-01-31 DIAGNOSIS — Z5111 Encounter for antineoplastic chemotherapy: Secondary | ICD-10-CM | POA: Diagnosis not present

## 2020-01-31 DIAGNOSIS — Z510 Encounter for antineoplastic radiation therapy: Secondary | ICD-10-CM | POA: Diagnosis not present

## 2020-01-31 DIAGNOSIS — C3481 Malignant neoplasm of overlapping sites of right bronchus and lung: Secondary | ICD-10-CM | POA: Diagnosis not present

## 2020-02-01 DIAGNOSIS — C3481 Malignant neoplasm of overlapping sites of right bronchus and lung: Secondary | ICD-10-CM | POA: Diagnosis not present

## 2020-02-03 DIAGNOSIS — Z8249 Family history of ischemic heart disease and other diseases of the circulatory system: Secondary | ICD-10-CM | POA: Diagnosis not present

## 2020-02-03 DIAGNOSIS — C3481 Malignant neoplasm of overlapping sites of right bronchus and lung: Secondary | ICD-10-CM | POA: Diagnosis not present

## 2020-02-03 DIAGNOSIS — I499 Cardiac arrhythmia, unspecified: Secondary | ICD-10-CM | POA: Diagnosis not present

## 2020-02-03 DIAGNOSIS — E785 Hyperlipidemia, unspecified: Secondary | ICD-10-CM | POA: Diagnosis not present

## 2020-02-06 DIAGNOSIS — N289 Disorder of kidney and ureter, unspecified: Secondary | ICD-10-CM | POA: Diagnosis not present

## 2020-02-06 DIAGNOSIS — R531 Weakness: Secondary | ICD-10-CM | POA: Diagnosis not present

## 2020-02-06 DIAGNOSIS — D61818 Other pancytopenia: Secondary | ICD-10-CM | POA: Diagnosis not present

## 2020-02-06 DIAGNOSIS — R52 Pain, unspecified: Secondary | ICD-10-CM | POA: Diagnosis not present

## 2020-02-06 DIAGNOSIS — I959 Hypotension, unspecified: Secondary | ICD-10-CM | POA: Diagnosis not present

## 2020-02-06 DIAGNOSIS — D649 Anemia, unspecified: Secondary | ICD-10-CM | POA: Diagnosis not present

## 2020-02-06 DIAGNOSIS — Z8711 Personal history of peptic ulcer disease: Secondary | ICD-10-CM | POA: Diagnosis not present

## 2020-02-06 DIAGNOSIS — R000 Tachycardia, unspecified: Secondary | ICD-10-CM | POA: Diagnosis not present

## 2020-02-06 DIAGNOSIS — R195 Other fecal abnormalities: Secondary | ICD-10-CM | POA: Diagnosis not present

## 2020-02-06 DIAGNOSIS — E041 Nontoxic single thyroid nodule: Secondary | ICD-10-CM | POA: Diagnosis not present

## 2020-02-06 DIAGNOSIS — M542 Cervicalgia: Secondary | ICD-10-CM | POA: Diagnosis not present

## 2020-02-06 DIAGNOSIS — E1165 Type 2 diabetes mellitus with hyperglycemia: Secondary | ICD-10-CM | POA: Diagnosis not present

## 2020-02-06 DIAGNOSIS — M47812 Spondylosis without myelopathy or radiculopathy, cervical region: Secondary | ICD-10-CM | POA: Diagnosis not present

## 2020-02-06 DIAGNOSIS — R0902 Hypoxemia: Secondary | ICD-10-CM | POA: Diagnosis not present

## 2020-02-06 DIAGNOSIS — N179 Acute kidney failure, unspecified: Secondary | ICD-10-CM | POA: Diagnosis not present

## 2020-02-06 DIAGNOSIS — S299XXA Unspecified injury of thorax, initial encounter: Secondary | ICD-10-CM | POA: Diagnosis not present

## 2020-02-06 DIAGNOSIS — E860 Dehydration: Secondary | ICD-10-CM | POA: Diagnosis not present

## 2020-02-07 DIAGNOSIS — N179 Acute kidney failure, unspecified: Secondary | ICD-10-CM | POA: Diagnosis not present

## 2020-02-07 DIAGNOSIS — R195 Other fecal abnormalities: Secondary | ICD-10-CM | POA: Diagnosis not present

## 2020-02-07 DIAGNOSIS — E860 Dehydration: Secondary | ICD-10-CM | POA: Diagnosis not present

## 2020-02-07 DIAGNOSIS — Z8711 Personal history of peptic ulcer disease: Secondary | ICD-10-CM | POA: Diagnosis not present

## 2020-02-07 DIAGNOSIS — E1165 Type 2 diabetes mellitus with hyperglycemia: Secondary | ICD-10-CM | POA: Diagnosis not present

## 2020-02-07 DIAGNOSIS — D61818 Other pancytopenia: Secondary | ICD-10-CM | POA: Diagnosis not present

## 2020-02-07 DIAGNOSIS — D649 Anemia, unspecified: Secondary | ICD-10-CM | POA: Diagnosis not present

## 2020-02-08 DIAGNOSIS — R195 Other fecal abnormalities: Secondary | ICD-10-CM | POA: Diagnosis not present

## 2020-02-08 DIAGNOSIS — D649 Anemia, unspecified: Secondary | ICD-10-CM | POA: Diagnosis not present

## 2020-02-08 DIAGNOSIS — E1165 Type 2 diabetes mellitus with hyperglycemia: Secondary | ICD-10-CM | POA: Diagnosis not present

## 2020-02-08 DIAGNOSIS — N179 Acute kidney failure, unspecified: Secondary | ICD-10-CM | POA: Diagnosis not present

## 2020-02-10 DIAGNOSIS — J188 Other pneumonia, unspecified organism: Secondary | ICD-10-CM | POA: Diagnosis not present

## 2020-02-10 DIAGNOSIS — E860 Dehydration: Secondary | ICD-10-CM | POA: Diagnosis not present

## 2020-02-10 DIAGNOSIS — R110 Nausea: Secondary | ICD-10-CM | POA: Diagnosis not present

## 2020-02-10 DIAGNOSIS — E1165 Type 2 diabetes mellitus with hyperglycemia: Secondary | ICD-10-CM | POA: Diagnosis not present

## 2020-02-10 DIAGNOSIS — R1111 Vomiting without nausea: Secondary | ICD-10-CM | POA: Diagnosis not present

## 2020-02-10 DIAGNOSIS — R531 Weakness: Secondary | ICD-10-CM | POA: Diagnosis not present

## 2020-02-10 DIAGNOSIS — R5383 Other fatigue: Secondary | ICD-10-CM | POA: Diagnosis not present

## 2020-02-10 DIAGNOSIS — R112 Nausea with vomiting, unspecified: Secondary | ICD-10-CM | POA: Diagnosis not present

## 2020-02-10 DIAGNOSIS — R000 Tachycardia, unspecified: Secondary | ICD-10-CM | POA: Diagnosis not present

## 2020-02-10 DIAGNOSIS — R52 Pain, unspecified: Secondary | ICD-10-CM | POA: Diagnosis not present

## 2020-02-10 DIAGNOSIS — K5730 Diverticulosis of large intestine without perforation or abscess without bleeding: Secondary | ICD-10-CM | POA: Diagnosis not present

## 2020-02-10 DIAGNOSIS — K760 Fatty (change of) liver, not elsewhere classified: Secondary | ICD-10-CM | POA: Diagnosis not present

## 2020-02-10 DIAGNOSIS — D709 Neutropenia, unspecified: Secondary | ICD-10-CM | POA: Diagnosis not present

## 2020-02-10 DIAGNOSIS — R918 Other nonspecific abnormal finding of lung field: Secondary | ICD-10-CM | POA: Diagnosis not present

## 2020-02-11 DIAGNOSIS — E1165 Type 2 diabetes mellitus with hyperglycemia: Secondary | ICD-10-CM | POA: Diagnosis present

## 2020-02-11 DIAGNOSIS — E785 Hyperlipidemia, unspecified: Secondary | ICD-10-CM | POA: Diagnosis present

## 2020-02-11 DIAGNOSIS — F419 Anxiety disorder, unspecified: Secondary | ICD-10-CM | POA: Diagnosis present

## 2020-02-11 DIAGNOSIS — Z91041 Radiographic dye allergy status: Secondary | ICD-10-CM | POA: Diagnosis not present

## 2020-02-11 DIAGNOSIS — F17210 Nicotine dependence, cigarettes, uncomplicated: Secondary | ICD-10-CM | POA: Diagnosis present

## 2020-02-11 DIAGNOSIS — Z794 Long term (current) use of insulin: Secondary | ICD-10-CM | POA: Diagnosis not present

## 2020-02-11 DIAGNOSIS — J440 Chronic obstructive pulmonary disease with acute lower respiratory infection: Secondary | ICD-10-CM | POA: Diagnosis present

## 2020-02-11 DIAGNOSIS — Z8673 Personal history of transient ischemic attack (TIA), and cerebral infarction without residual deficits: Secondary | ICD-10-CM | POA: Diagnosis not present

## 2020-02-11 DIAGNOSIS — E860 Dehydration: Secondary | ICD-10-CM | POA: Diagnosis not present

## 2020-02-11 DIAGNOSIS — R197 Diarrhea, unspecified: Secondary | ICD-10-CM | POA: Diagnosis present

## 2020-02-11 DIAGNOSIS — R918 Other nonspecific abnormal finding of lung field: Secondary | ICD-10-CM | POA: Diagnosis not present

## 2020-02-11 DIAGNOSIS — E871 Hypo-osmolality and hyponatremia: Secondary | ICD-10-CM | POA: Diagnosis present

## 2020-02-11 DIAGNOSIS — D509 Iron deficiency anemia, unspecified: Secondary | ICD-10-CM | POA: Diagnosis not present

## 2020-02-11 DIAGNOSIS — I10 Essential (primary) hypertension: Secondary | ICD-10-CM | POA: Diagnosis present

## 2020-02-11 DIAGNOSIS — R1310 Dysphagia, unspecified: Secondary | ICD-10-CM | POA: Diagnosis present

## 2020-02-11 DIAGNOSIS — J9811 Atelectasis: Secondary | ICD-10-CM | POA: Diagnosis not present

## 2020-02-11 DIAGNOSIS — Z85118 Personal history of other malignant neoplasm of bronchus and lung: Secondary | ICD-10-CM | POA: Diagnosis not present

## 2020-02-11 DIAGNOSIS — F329 Major depressive disorder, single episode, unspecified: Secondary | ICD-10-CM | POA: Diagnosis present

## 2020-02-11 DIAGNOSIS — K1230 Oral mucositis (ulcerative), unspecified: Secondary | ICD-10-CM | POA: Diagnosis present

## 2020-02-11 DIAGNOSIS — D473 Essential (hemorrhagic) thrombocythemia: Secondary | ICD-10-CM | POA: Diagnosis not present

## 2020-02-11 DIAGNOSIS — T451X5A Adverse effect of antineoplastic and immunosuppressive drugs, initial encounter: Secondary | ICD-10-CM | POA: Diagnosis present

## 2020-02-11 DIAGNOSIS — R112 Nausea with vomiting, unspecified: Secondary | ICD-10-CM | POA: Diagnosis present

## 2020-02-11 DIAGNOSIS — K921 Melena: Secondary | ICD-10-CM | POA: Diagnosis present

## 2020-02-11 DIAGNOSIS — J188 Other pneumonia, unspecified organism: Secondary | ICD-10-CM | POA: Diagnosis not present

## 2020-02-11 DIAGNOSIS — E1140 Type 2 diabetes mellitus with diabetic neuropathy, unspecified: Secondary | ICD-10-CM | POA: Diagnosis present

## 2020-02-11 DIAGNOSIS — D709 Neutropenia, unspecified: Secondary | ICD-10-CM | POA: Diagnosis not present

## 2020-02-11 DIAGNOSIS — R000 Tachycardia, unspecified: Secondary | ICD-10-CM | POA: Diagnosis present

## 2020-02-11 DIAGNOSIS — K5730 Diverticulosis of large intestine without perforation or abscess without bleeding: Secondary | ICD-10-CM | POA: Diagnosis not present

## 2020-02-11 DIAGNOSIS — R5383 Other fatigue: Secondary | ICD-10-CM | POA: Diagnosis not present

## 2020-02-11 DIAGNOSIS — J189 Pneumonia, unspecified organism: Secondary | ICD-10-CM | POA: Diagnosis not present

## 2020-02-11 DIAGNOSIS — K649 Unspecified hemorrhoids: Secondary | ICD-10-CM | POA: Diagnosis present

## 2020-02-11 DIAGNOSIS — C3481 Malignant neoplasm of overlapping sites of right bronchus and lung: Secondary | ICD-10-CM | POA: Diagnosis present

## 2020-02-11 DIAGNOSIS — D701 Agranulocytosis secondary to cancer chemotherapy: Secondary | ICD-10-CM | POA: Diagnosis present

## 2020-02-11 DIAGNOSIS — E119 Type 2 diabetes mellitus without complications: Secondary | ICD-10-CM | POA: Diagnosis not present

## 2020-02-11 DIAGNOSIS — K760 Fatty (change of) liver, not elsewhere classified: Secondary | ICD-10-CM | POA: Diagnosis not present

## 2020-02-11 DIAGNOSIS — R531 Weakness: Secondary | ICD-10-CM | POA: Diagnosis not present

## 2020-02-11 DIAGNOSIS — M1990 Unspecified osteoarthritis, unspecified site: Secondary | ICD-10-CM | POA: Diagnosis present

## 2020-02-11 DIAGNOSIS — R195 Other fecal abnormalities: Secondary | ICD-10-CM | POA: Diagnosis not present

## 2020-02-12 DIAGNOSIS — R112 Nausea with vomiting, unspecified: Secondary | ICD-10-CM | POA: Diagnosis not present

## 2020-02-12 DIAGNOSIS — E119 Type 2 diabetes mellitus without complications: Secondary | ICD-10-CM | POA: Diagnosis not present

## 2020-02-12 DIAGNOSIS — J189 Pneumonia, unspecified organism: Secondary | ICD-10-CM | POA: Diagnosis not present

## 2020-02-13 DIAGNOSIS — E119 Type 2 diabetes mellitus without complications: Secondary | ICD-10-CM | POA: Diagnosis not present

## 2020-02-13 DIAGNOSIS — J189 Pneumonia, unspecified organism: Secondary | ICD-10-CM | POA: Diagnosis not present

## 2020-02-13 DIAGNOSIS — R112 Nausea with vomiting, unspecified: Secondary | ICD-10-CM | POA: Diagnosis not present

## 2020-02-14 DIAGNOSIS — R112 Nausea with vomiting, unspecified: Secondary | ICD-10-CM | POA: Diagnosis not present

## 2020-02-14 DIAGNOSIS — J189 Pneumonia, unspecified organism: Secondary | ICD-10-CM

## 2020-02-14 DIAGNOSIS — R000 Tachycardia, unspecified: Secondary | ICD-10-CM

## 2020-02-14 DIAGNOSIS — E119 Type 2 diabetes mellitus without complications: Secondary | ICD-10-CM | POA: Diagnosis not present

## 2020-02-15 DIAGNOSIS — J189 Pneumonia, unspecified organism: Secondary | ICD-10-CM | POA: Diagnosis not present

## 2020-02-15 DIAGNOSIS — R112 Nausea with vomiting, unspecified: Secondary | ICD-10-CM | POA: Diagnosis not present

## 2020-02-15 DIAGNOSIS — E119 Type 2 diabetes mellitus without complications: Secondary | ICD-10-CM | POA: Diagnosis not present

## 2020-02-17 DIAGNOSIS — E876 Hypokalemia: Secondary | ICD-10-CM | POA: Diagnosis not present

## 2020-02-17 DIAGNOSIS — R000 Tachycardia, unspecified: Secondary | ICD-10-CM | POA: Diagnosis not present

## 2020-02-17 DIAGNOSIS — J189 Pneumonia, unspecified organism: Secondary | ICD-10-CM | POA: Diagnosis not present

## 2020-02-17 DIAGNOSIS — Z7689 Persons encountering health services in other specified circumstances: Secondary | ICD-10-CM | POA: Diagnosis not present

## 2020-02-20 DIAGNOSIS — I959 Hypotension, unspecified: Secondary | ICD-10-CM | POA: Diagnosis not present

## 2020-02-20 DIAGNOSIS — R000 Tachycardia, unspecified: Secondary | ICD-10-CM | POA: Diagnosis not present

## 2020-02-20 DIAGNOSIS — D649 Anemia, unspecified: Secondary | ICD-10-CM | POA: Diagnosis not present

## 2020-02-28 DIAGNOSIS — D509 Iron deficiency anemia, unspecified: Secondary | ICD-10-CM

## 2020-02-28 DIAGNOSIS — C3481 Malignant neoplasm of overlapping sites of right bronchus and lung: Secondary | ICD-10-CM | POA: Diagnosis not present

## 2020-02-28 DIAGNOSIS — D473 Essential (hemorrhagic) thrombocythemia: Secondary | ICD-10-CM | POA: Diagnosis not present

## 2020-03-03 DIAGNOSIS — E860 Dehydration: Secondary | ICD-10-CM | POA: Diagnosis not present

## 2020-03-06 DIAGNOSIS — D509 Iron deficiency anemia, unspecified: Secondary | ICD-10-CM

## 2020-03-06 DIAGNOSIS — C3481 Malignant neoplasm of overlapping sites of right bronchus and lung: Secondary | ICD-10-CM

## 2020-03-06 DIAGNOSIS — D473 Essential (hemorrhagic) thrombocythemia: Secondary | ICD-10-CM

## 2020-03-17 DIAGNOSIS — C3490 Malignant neoplasm of unspecified part of unspecified bronchus or lung: Secondary | ICD-10-CM | POA: Diagnosis not present

## 2020-03-17 DIAGNOSIS — J441 Chronic obstructive pulmonary disease with (acute) exacerbation: Secondary | ICD-10-CM | POA: Diagnosis not present

## 2020-03-17 DIAGNOSIS — F17200 Nicotine dependence, unspecified, uncomplicated: Secondary | ICD-10-CM | POA: Diagnosis not present

## 2020-03-28 DIAGNOSIS — F3289 Other specified depressive episodes: Secondary | ICD-10-CM | POA: Diagnosis not present

## 2020-04-04 DIAGNOSIS — F17210 Nicotine dependence, cigarettes, uncomplicated: Secondary | ICD-10-CM | POA: Diagnosis not present

## 2020-04-04 DIAGNOSIS — R911 Solitary pulmonary nodule: Secondary | ICD-10-CM | POA: Diagnosis not present

## 2020-04-04 DIAGNOSIS — J84115 Respiratory bronchiolitis interstitial lung disease: Secondary | ICD-10-CM | POA: Diagnosis not present

## 2020-04-04 DIAGNOSIS — J449 Chronic obstructive pulmonary disease, unspecified: Secondary | ICD-10-CM | POA: Diagnosis not present

## 2020-05-08 DIAGNOSIS — Z23 Encounter for immunization: Secondary | ICD-10-CM | POA: Diagnosis not present

## 2020-05-08 DIAGNOSIS — C801 Malignant (primary) neoplasm, unspecified: Secondary | ICD-10-CM | POA: Diagnosis not present

## 2020-05-08 DIAGNOSIS — J449 Chronic obstructive pulmonary disease, unspecified: Secondary | ICD-10-CM | POA: Diagnosis not present

## 2020-05-08 DIAGNOSIS — F17210 Nicotine dependence, cigarettes, uncomplicated: Secondary | ICD-10-CM | POA: Diagnosis not present

## 2020-05-30 DIAGNOSIS — F419 Anxiety disorder, unspecified: Secondary | ICD-10-CM | POA: Diagnosis not present

## 2020-05-30 DIAGNOSIS — R69 Illness, unspecified: Secondary | ICD-10-CM | POA: Diagnosis not present

## 2020-05-30 DIAGNOSIS — Z1379 Encounter for other screening for genetic and chromosomal anomalies: Secondary | ICD-10-CM | POA: Diagnosis not present

## 2020-06-05 DIAGNOSIS — Z23 Encounter for immunization: Secondary | ICD-10-CM | POA: Diagnosis not present

## 2020-06-20 DIAGNOSIS — D473 Essential (hemorrhagic) thrombocythemia: Secondary | ICD-10-CM | POA: Diagnosis not present

## 2020-06-20 DIAGNOSIS — E785 Hyperlipidemia, unspecified: Secondary | ICD-10-CM | POA: Diagnosis not present

## 2020-06-20 DIAGNOSIS — J449 Chronic obstructive pulmonary disease, unspecified: Secondary | ICD-10-CM | POA: Diagnosis not present

## 2020-06-20 DIAGNOSIS — Z1331 Encounter for screening for depression: Secondary | ICD-10-CM | POA: Diagnosis not present

## 2020-06-20 DIAGNOSIS — Z713 Dietary counseling and surveillance: Secondary | ICD-10-CM | POA: Diagnosis not present

## 2020-06-20 DIAGNOSIS — Z6824 Body mass index (BMI) 24.0-24.9, adult: Secondary | ICD-10-CM | POA: Diagnosis not present

## 2020-06-20 DIAGNOSIS — K219 Gastro-esophageal reflux disease without esophagitis: Secondary | ICD-10-CM | POA: Diagnosis not present

## 2020-06-20 DIAGNOSIS — E1169 Type 2 diabetes mellitus with other specified complication: Secondary | ICD-10-CM | POA: Diagnosis not present

## 2020-06-20 DIAGNOSIS — Z85118 Personal history of other malignant neoplasm of bronchus and lung: Secondary | ICD-10-CM | POA: Diagnosis not present

## 2020-06-20 DIAGNOSIS — Z87891 Personal history of nicotine dependence: Secondary | ICD-10-CM | POA: Diagnosis not present

## 2020-07-03 DIAGNOSIS — I10 Essential (primary) hypertension: Secondary | ICD-10-CM | POA: Diagnosis not present

## 2020-07-03 DIAGNOSIS — F419 Anxiety disorder, unspecified: Secondary | ICD-10-CM | POA: Diagnosis not present

## 2020-07-03 DIAGNOSIS — Z794 Long term (current) use of insulin: Secondary | ICD-10-CM | POA: Diagnosis not present

## 2020-07-03 DIAGNOSIS — C3490 Malignant neoplasm of unspecified part of unspecified bronchus or lung: Secondary | ICD-10-CM | POA: Diagnosis not present

## 2020-07-03 DIAGNOSIS — E1142 Type 2 diabetes mellitus with diabetic polyneuropathy: Secondary | ICD-10-CM | POA: Diagnosis not present

## 2020-07-03 DIAGNOSIS — G8929 Other chronic pain: Secondary | ICD-10-CM | POA: Diagnosis not present

## 2020-07-03 DIAGNOSIS — K219 Gastro-esophageal reflux disease without esophagitis: Secondary | ICD-10-CM | POA: Diagnosis not present

## 2020-07-03 DIAGNOSIS — E785 Hyperlipidemia, unspecified: Secondary | ICD-10-CM | POA: Diagnosis not present

## 2020-07-03 DIAGNOSIS — R69 Illness, unspecified: Secondary | ICD-10-CM | POA: Diagnosis not present

## 2020-07-03 DIAGNOSIS — J45909 Unspecified asthma, uncomplicated: Secondary | ICD-10-CM | POA: Diagnosis not present

## 2020-07-16 DIAGNOSIS — E119 Type 2 diabetes mellitus without complications: Secondary | ICD-10-CM | POA: Diagnosis not present

## 2020-07-17 DIAGNOSIS — J449 Chronic obstructive pulmonary disease, unspecified: Secondary | ICD-10-CM | POA: Diagnosis not present

## 2020-07-17 DIAGNOSIS — C801 Malignant (primary) neoplasm, unspecified: Secondary | ICD-10-CM | POA: Diagnosis not present

## 2020-07-17 DIAGNOSIS — F17210 Nicotine dependence, cigarettes, uncomplicated: Secondary | ICD-10-CM | POA: Diagnosis not present

## 2020-07-30 DIAGNOSIS — M5186 Other intervertebral disc disorders, lumbar region: Secondary | ICD-10-CM | POA: Diagnosis not present

## 2020-07-30 DIAGNOSIS — M545 Low back pain: Secondary | ICD-10-CM | POA: Diagnosis not present

## 2020-07-30 DIAGNOSIS — M5489 Other dorsalgia: Secondary | ICD-10-CM | POA: Diagnosis not present

## 2020-08-06 DIAGNOSIS — E041 Nontoxic single thyroid nodule: Secondary | ICD-10-CM | POA: Diagnosis not present

## 2020-08-06 DIAGNOSIS — D473 Essential (hemorrhagic) thrombocythemia: Secondary | ICD-10-CM | POA: Diagnosis not present

## 2020-08-06 DIAGNOSIS — R93421 Abnormal radiologic findings on diagnostic imaging of right kidney: Secondary | ICD-10-CM | POA: Diagnosis not present

## 2020-08-06 DIAGNOSIS — R69 Illness, unspecified: Secondary | ICD-10-CM | POA: Diagnosis not present

## 2020-08-06 DIAGNOSIS — C3481 Malignant neoplasm of overlapping sites of right bronchus and lung: Secondary | ICD-10-CM | POA: Diagnosis not present

## 2020-08-06 DIAGNOSIS — I2510 Atherosclerotic heart disease of native coronary artery without angina pectoris: Secondary | ICD-10-CM | POA: Diagnosis not present

## 2020-08-06 DIAGNOSIS — I700 Atherosclerosis of aorta: Secondary | ICD-10-CM | POA: Diagnosis not present

## 2020-08-06 DIAGNOSIS — C3490 Malignant neoplasm of unspecified part of unspecified bronchus or lung: Secondary | ICD-10-CM | POA: Diagnosis not present

## 2020-08-06 DIAGNOSIS — K760 Fatty (change of) liver, not elsewhere classified: Secondary | ICD-10-CM | POA: Diagnosis not present

## 2020-08-06 DIAGNOSIS — J219 Acute bronchiolitis, unspecified: Secondary | ICD-10-CM | POA: Diagnosis not present

## 2020-08-06 DIAGNOSIS — J432 Centrilobular emphysema: Secondary | ICD-10-CM | POA: Diagnosis not present

## 2020-08-06 DIAGNOSIS — D509 Iron deficiency anemia, unspecified: Secondary | ICD-10-CM | POA: Diagnosis not present

## 2020-08-07 DIAGNOSIS — Z85118 Personal history of other malignant neoplasm of bronchus and lung: Secondary | ICD-10-CM | POA: Diagnosis not present

## 2020-08-07 DIAGNOSIS — D509 Iron deficiency anemia, unspecified: Secondary | ICD-10-CM | POA: Diagnosis not present

## 2020-08-29 DIAGNOSIS — Z6826 Body mass index (BMI) 26.0-26.9, adult: Secondary | ICD-10-CM | POA: Diagnosis not present

## 2020-08-29 DIAGNOSIS — E785 Hyperlipidemia, unspecified: Secondary | ICD-10-CM | POA: Diagnosis not present

## 2020-08-29 DIAGNOSIS — E1169 Type 2 diabetes mellitus with other specified complication: Secondary | ICD-10-CM | POA: Diagnosis not present

## 2020-11-18 DIAGNOSIS — R079 Chest pain, unspecified: Secondary | ICD-10-CM

## 2020-11-19 DIAGNOSIS — R079 Chest pain, unspecified: Secondary | ICD-10-CM | POA: Diagnosis not present

## 2020-11-20 DIAGNOSIS — Z9981 Dependence on supplemental oxygen: Secondary | ICD-10-CM | POA: Diagnosis not present

## 2020-11-20 DIAGNOSIS — J189 Pneumonia, unspecified organism: Secondary | ICD-10-CM | POA: Diagnosis not present

## 2020-11-20 DIAGNOSIS — I313 Pericardial effusion (noninflammatory): Secondary | ICD-10-CM

## 2020-11-20 DIAGNOSIS — J9601 Acute respiratory failure with hypoxia: Secondary | ICD-10-CM | POA: Diagnosis not present

## 2020-11-20 DIAGNOSIS — C3481 Malignant neoplasm of overlapping sites of right bronchus and lung: Secondary | ICD-10-CM | POA: Diagnosis not present

## 2020-11-21 DIAGNOSIS — Z9981 Dependence on supplemental oxygen: Secondary | ICD-10-CM | POA: Diagnosis not present

## 2020-11-21 DIAGNOSIS — C3481 Malignant neoplasm of overlapping sites of right bronchus and lung: Secondary | ICD-10-CM | POA: Diagnosis not present

## 2020-11-21 DIAGNOSIS — J9601 Acute respiratory failure with hypoxia: Secondary | ICD-10-CM | POA: Diagnosis not present

## 2020-11-21 DIAGNOSIS — J189 Pneumonia, unspecified organism: Secondary | ICD-10-CM | POA: Diagnosis not present

## 2020-11-22 DIAGNOSIS — Z9981 Dependence on supplemental oxygen: Secondary | ICD-10-CM | POA: Diagnosis not present

## 2020-11-22 DIAGNOSIS — J189 Pneumonia, unspecified organism: Secondary | ICD-10-CM | POA: Diagnosis not present

## 2020-11-22 DIAGNOSIS — C3481 Malignant neoplasm of overlapping sites of right bronchus and lung: Secondary | ICD-10-CM | POA: Diagnosis not present

## 2020-11-22 DIAGNOSIS — J9601 Acute respiratory failure with hypoxia: Secondary | ICD-10-CM | POA: Diagnosis not present

## 2020-11-23 DIAGNOSIS — C3481 Malignant neoplasm of overlapping sites of right bronchus and lung: Secondary | ICD-10-CM | POA: Diagnosis not present

## 2020-11-23 DIAGNOSIS — Z9981 Dependence on supplemental oxygen: Secondary | ICD-10-CM | POA: Diagnosis not present

## 2020-11-23 DIAGNOSIS — J9601 Acute respiratory failure with hypoxia: Secondary | ICD-10-CM | POA: Diagnosis not present

## 2020-11-23 DIAGNOSIS — J189 Pneumonia, unspecified organism: Secondary | ICD-10-CM | POA: Diagnosis not present

## 2020-11-26 DIAGNOSIS — C3481 Malignant neoplasm of overlapping sites of right bronchus and lung: Secondary | ICD-10-CM | POA: Diagnosis not present

## 2020-11-26 DIAGNOSIS — J189 Pneumonia, unspecified organism: Secondary | ICD-10-CM | POA: Diagnosis not present

## 2020-11-26 DIAGNOSIS — J9601 Acute respiratory failure with hypoxia: Secondary | ICD-10-CM | POA: Diagnosis not present

## 2020-11-26 DIAGNOSIS — Z9981 Dependence on supplemental oxygen: Secondary | ICD-10-CM | POA: Diagnosis not present

## 2020-11-27 DIAGNOSIS — Z9981 Dependence on supplemental oxygen: Secondary | ICD-10-CM | POA: Diagnosis not present

## 2020-11-27 DIAGNOSIS — J9601 Acute respiratory failure with hypoxia: Secondary | ICD-10-CM | POA: Diagnosis not present

## 2020-11-27 DIAGNOSIS — J189 Pneumonia, unspecified organism: Secondary | ICD-10-CM | POA: Diagnosis not present

## 2020-11-27 DIAGNOSIS — C3481 Malignant neoplasm of overlapping sites of right bronchus and lung: Secondary | ICD-10-CM | POA: Diagnosis not present

## 2020-11-28 DIAGNOSIS — J9601 Acute respiratory failure with hypoxia: Secondary | ICD-10-CM | POA: Diagnosis not present

## 2020-11-28 DIAGNOSIS — J189 Pneumonia, unspecified organism: Secondary | ICD-10-CM | POA: Diagnosis not present

## 2020-11-28 DIAGNOSIS — Z9981 Dependence on supplemental oxygen: Secondary | ICD-10-CM | POA: Diagnosis not present

## 2020-11-28 DIAGNOSIS — C3481 Malignant neoplasm of overlapping sites of right bronchus and lung: Secondary | ICD-10-CM | POA: Diagnosis not present

## 2020-11-30 DIAGNOSIS — Z9981 Dependence on supplemental oxygen: Secondary | ICD-10-CM | POA: Diagnosis not present

## 2020-11-30 DIAGNOSIS — J189 Pneumonia, unspecified organism: Secondary | ICD-10-CM | POA: Diagnosis not present

## 2020-11-30 DIAGNOSIS — C3481 Malignant neoplasm of overlapping sites of right bronchus and lung: Secondary | ICD-10-CM | POA: Diagnosis not present

## 2020-11-30 DIAGNOSIS — J9601 Acute respiratory failure with hypoxia: Secondary | ICD-10-CM | POA: Diagnosis not present

## 2020-12-04 DIAGNOSIS — J441 Chronic obstructive pulmonary disease with (acute) exacerbation: Secondary | ICD-10-CM | POA: Diagnosis not present

## 2020-12-04 DIAGNOSIS — I10 Essential (primary) hypertension: Secondary | ICD-10-CM | POA: Diagnosis not present

## 2020-12-04 DIAGNOSIS — R5381 Other malaise: Secondary | ICD-10-CM | POA: Diagnosis not present

## 2020-12-04 DIAGNOSIS — R748 Abnormal levels of other serum enzymes: Secondary | ICD-10-CM | POA: Diagnosis not present

## 2020-12-04 DIAGNOSIS — J9601 Acute respiratory failure with hypoxia: Secondary | ICD-10-CM | POA: Diagnosis not present

## 2020-12-04 DIAGNOSIS — R079 Chest pain, unspecified: Secondary | ICD-10-CM | POA: Diagnosis not present

## 2020-12-04 DIAGNOSIS — E871 Hypo-osmolality and hyponatremia: Secondary | ICD-10-CM | POA: Diagnosis not present

## 2020-12-04 DIAGNOSIS — T391X1S Poisoning by 4-Aminophenol derivatives, accidental (unintentional), sequela: Secondary | ICD-10-CM | POA: Diagnosis not present

## 2020-12-05 NOTE — Progress Notes (Signed)
Angela Kane  [address] [place],  Angela Kane  [id number] [phone]  Clinic Day:  12/06/2020  Referring physician: Renaldo Reel, PA   HISTORY OF PRESENT ILLNESS:  The patient is a 59 y.o. female with limited stage small-cell lung cancer.  The patient comes in again today to for routine follow-up.  Since her last visit, the patient was hospitalized for pneumonia and an accidental Tylenol overdose.  Fortunately, her lives enzymes improved after receiving N-acetylcysteine.  Her bilateral pneumonia also improved after being on IV antibiotics.  However, due to her poor baseline health after this hospitalization, the patient is now at a skilled nursing facility.  Overall, the patient has been doing okay.  She denies having significant shortness of breath or other respiratory symptoms which concern her for recurrent small-cell cancer being present.  Unfortunately, despite our numerous discussions in the past about the importance of smoking abstinence, she continues to smoke on a daily basis.  PHYSICAL EXAM:  Blood pressure (!) 106/51, pulse (!) 133, temperature 97.9 F (36.6 C), temperature source Oral, resp. rate 18, height 5' 1.5" (1.562 m), weight 118 lb 11.2 oz (53.8 kg), SpO2 95 %. Wt Readings from Last 3 Encounters:  12/06/20 118 lb 11.2 oz (53.8 kg)  11/30/19 140 lb (63.5 kg)  11/22/19 110 lb 12.8 oz (50.3 kg)   Body mass index is 22.06 kg/m. Performance status (ECOG): 3 - Symptomatic, >50% confined to bed Physical Exam Constitutional:      Appearance: She is ill-appearing.  HENT:     Mouth/Throat:     Mouth: Mucous membranes are dry.     Pharynx: Oropharynx is clear. No oropharyngeal exudate or posterior oropharyngeal erythema.  Cardiovascular:     Rate and Rhythm: Normal rate and regular rhythm.     Heart sounds: No murmur heard. No friction rub. No gallop.   Pulmonary:     Effort: Pulmonary effort is normal. No respiratory distress.      Breath sounds: Rales present. No wheezing or rhonchi.  Chest:  Breasts:     Right: No axillary adenopathy or supraclavicular adenopathy.     Left: No axillary adenopathy or supraclavicular adenopathy.    Abdominal:     General: Bowel sounds are normal. There is no distension.     Palpations: Abdomen is soft. There is no mass.     Tenderness: There is no abdominal tenderness.  Musculoskeletal:        General: No swelling.     Right lower leg: No edema.     Left lower leg: No edema.  Lymphadenopathy:     Cervical: No cervical adenopathy.     Upper Body:     Right upper body: No supraclavicular or axillary adenopathy.     Left upper body: No supraclavicular or axillary adenopathy.     Lower Body: No right inguinal adenopathy. No left inguinal adenopathy.  Skin:    General: Skin is warm.     Coloration: Skin is not jaundiced.     Findings: No lesion or rash.  Neurological:     General: No focal deficit present.     Mental Status: She is alert and oriented to person, place, and time. Mental status is at baseline.     Cranial Nerves: Cranial nerves are intact.  Psychiatric:        Mood and Affect: Mood normal.        Behavior: Behavior normal.        Thought  Content: Thought content normal.     LABS:   CBC Latest Ref Rng & Units 11/30/2019 11/22/2019 02/05/2018  WBC 4.0 - 10.5 K/uL 11.9(H) 15.3(H) 15.9(H)  Hemoglobin 12.0 - 15.0 g/dL 14.5 14.1 18.3(H)  Hematocrit 36.0 - 46.0 % 46.0 42.7 53.4(H)  Platelets 150 - 400 K/uL 456(H) 540.0(H) 377   CMP Latest Ref Rng & Units 11/30/2019 11/22/2019 02/05/2018  Glucose 70 - 99 mg/dL 193(H) 214(H) 132(H)  BUN 6 - 20 mg/dL <5(L) 6 5(L)  Creatinine 0.44 - 1.00 mg/dL 0.81 0.76 0.65  Sodium 135 - 145 mmol/L 135 139 134(L)  Potassium 3.5 - 5.1 mmol/L 3.6 3.8 3.6  Chloride 98 - 111 mmol/L 102 104 102  CO2 22 - 32 mmol/L 22 27 19(L)  Calcium 8.9 - 10.3 mg/dL 8.9 9.3 9.4  Total Protein 6.5 - 8.1 g/dL 7.2 6.8 8.0  Total Bilirubin 0.3 - 1.2  mg/dL 0.6 0.3 0.6  Alkaline Phos 38 - 126 U/L 88 107 150(H)  AST 15 - 41 U/L 37 32 33  ALT 0 - 44 U/L 44 48(H) 31    STUDIES:  Her chest CT revealed the following: FINDINGS: Cardiovascular: This is a technically adequate evaluation of the pulmonary vasculature. No filling defects or pulmonary emboli.  The heart is unremarkable without pericardial effusion. Stable atherosclerosis of the thoracic aorta. No aneurysm or dissection.  Mediastinum/Nodes: 9 mm right infrahilar lymph node image 55 of series 3 slightly decreased since prior study. No pathologic adenopathy. Stable right thyroid nodule, previously evaluated by ultrasound and biopsy. The trachea and esophagus are unremarkable.  Lungs/Pleura: Upper lobe predominant emphysema again noted. Multifocal bilateral ground-glass airspace disease is identified, greatest in the upper lobes. No effusion or pneumothorax. The central airways are patent.  Upper Abdomen: Edema is partially visualized at the porta hepatis and surrounding the visualized portions of the pancreatic head. Please correlate with any clinical or laboratory evidence of pancreatitis or cholecystitis. Hepatic steatosis again noted and unchanged.  Musculoskeletal: No acute or destructive bony lesions. Reconstructed images demonstrate no additional findings.  Review of the MIP images confirms the above findings.  IMPRESSION: 1. Bilateral upper lobe predominant ground-glass airspace disease. Findings could reflect early edema or multifocal infection. 2. Edema surrounding the head of the pancreas and extending into the visualized portion of the porta hepatis, nonspecific. If cholecystitis or pancreatitis is suspected, imaging of the upper abdomen with ultrasound or CT could be performed. 3. No evidence of pulmonary embolus. 4. Hepatic steatosis. 5.  Aortic Atherosclerosis  (ICD10-I70.0). -------------------------------------------------------------------------------- A chest x-ray done 1 week ago revealed the following: FINDINGS: A right jugular Port-A-Cath remains in place with tip projecting over the mid to lower SVC. The cardiomediastinal silhouette is within normal limits. The lungs are better inflated than on the prior study, and bilateral lung opacities have nearly completely resolved. No sizable pleural effusion or pneumothorax is identified.  IMPRESSION: Near complete resolution of bilateral lung opacities.  ASSESSMENT & PLAN:  Assessment/Plan:  A 59 y.o. female with limited stage small cell lung cancer.  Based upon her recent chest x-rays and CT scans in the hospital, it appears she remains cancer-free.  Nevertheless, I remain very concerned with the patient's baseline health.  I was candid in explaining to her that if she does not take more control of her health, her life expectancy may very well be less than 6 months.  One of the health issues which she needs to immediately address is smoking abstinence.  With her continued smoking,  any minor pulmonary insult in her currently debilitated status could take her life.  Othrewise, I will see her back in 4 months for repeat clinical assessment, with a chest CT being done a day before her next visit for her continued lung cancer surveillance   The patient and her daughter understand all the plans discussed today and are in agreement with them.     Kay Ricciuti Macarthur Critchley, MD

## 2020-12-06 ENCOUNTER — Inpatient Hospital Stay: Payer: Medicare HMO | Attending: Oncology | Admitting: Oncology

## 2020-12-06 ENCOUNTER — Other Ambulatory Visit: Payer: Self-pay

## 2020-12-06 ENCOUNTER — Telehealth: Payer: Self-pay | Admitting: Oncology

## 2020-12-06 ENCOUNTER — Telehealth: Payer: Self-pay

## 2020-12-06 ENCOUNTER — Other Ambulatory Visit: Payer: Self-pay | Admitting: Oncology

## 2020-12-06 VITALS — BP 106/51 | HR 133 | Temp 97.9°F | Resp 18 | Ht 61.5 in | Wt 118.7 lb

## 2020-12-06 DIAGNOSIS — E861 Hypovolemia: Secondary | ICD-10-CM | POA: Diagnosis not present

## 2020-12-06 DIAGNOSIS — I9589 Other hypotension: Secondary | ICD-10-CM | POA: Diagnosis not present

## 2020-12-06 DIAGNOSIS — I959 Hypotension, unspecified: Secondary | ICD-10-CM | POA: Insufficient documentation

## 2020-12-06 DIAGNOSIS — Z85118 Personal history of other malignant neoplasm of bronchus and lung: Secondary | ICD-10-CM | POA: Insufficient documentation

## 2020-12-06 MED ORDER — SODIUM CHLORIDE 0.9% FLUSH
3.0000 mL | Freq: Once | INTRAVENOUS | Status: DC | PRN
  Filled 2020-12-06: qty 10

## 2020-12-06 MED ORDER — SODIUM CHLORIDE 0.9% FLUSH
10.0000 mL | Freq: Once | INTRAVENOUS | Status: AC | PRN
  Administered 2020-12-06: 10 mL
  Filled 2020-12-06: qty 10

## 2020-12-06 MED ORDER — ALTEPLASE 2 MG IJ SOLR
2.0000 mg | Freq: Once | INTRAMUSCULAR | Status: DC | PRN
  Filled 2020-12-06: qty 2

## 2020-12-06 MED ORDER — HEPARIN SOD (PORK) LOCK FLUSH 100 UNIT/ML IV SOLN
500.0000 [IU] | Freq: Once | INTRAVENOUS | Status: AC | PRN
  Administered 2020-12-06: 500 [IU]
  Filled 2020-12-06: qty 5

## 2020-12-06 MED ORDER — HEPARIN SOD (PORK) LOCK FLUSH 100 UNIT/ML IV SOLN
250.0000 [IU] | Freq: Once | INTRAVENOUS | Status: DC | PRN
  Filled 2020-12-06: qty 5

## 2020-12-06 MED ORDER — SODIUM CHLORIDE 0.9 % IV SOLN
Freq: Once | INTRAVENOUS | Status: AC
  Administered 2020-12-06 (×2): 999 mL/h via INTRAVENOUS
  Filled 2020-12-06: qty 250

## 2020-12-06 NOTE — Telephone Encounter (Signed)
Per 12/23 LOS, patient scheduled for 04/05/2021 Follow Up.  Labs, CT Scan will be scheduled in 2021/03/17 for April after receiving Authorization

## 2020-12-06 NOTE — Telephone Encounter (Signed)
Amber with [facility] called to let us know that Pt is on Metoprol 50mg  daily since her BP was severly low in clinic this afternoon.Dr. Bobby Rumpf reqguest that the let PCP or staff physician know about her low vital signs and follow his recommendations

## 2020-12-11 DIAGNOSIS — I10 Essential (primary) hypertension: Secondary | ICD-10-CM | POA: Diagnosis not present

## 2020-12-11 DIAGNOSIS — J9601 Acute respiratory failure with hypoxia: Secondary | ICD-10-CM | POA: Diagnosis not present

## 2020-12-11 DIAGNOSIS — R748 Abnormal levels of other serum enzymes: Secondary | ICD-10-CM | POA: Diagnosis not present

## 2020-12-11 DIAGNOSIS — J441 Chronic obstructive pulmonary disease with (acute) exacerbation: Secondary | ICD-10-CM | POA: Diagnosis not present

## 2020-12-11 DIAGNOSIS — T391X1S Poisoning by 4-Aminophenol derivatives, accidental (unintentional), sequela: Secondary | ICD-10-CM | POA: Diagnosis not present

## 2020-12-11 DIAGNOSIS — R5381 Other malaise: Secondary | ICD-10-CM | POA: Diagnosis not present

## 2020-12-11 DIAGNOSIS — E871 Hypo-osmolality and hyponatremia: Secondary | ICD-10-CM | POA: Diagnosis not present

## 2020-12-11 DIAGNOSIS — R079 Chest pain, unspecified: Secondary | ICD-10-CM | POA: Diagnosis not present

## 2020-12-13 ENCOUNTER — Telehealth: Payer: Self-pay

## 2020-12-13 NOTE — Telephone Encounter (Signed)
Last office note printed & faxed to [facility] (pt is resident of the SNF).  Fax [phone]

## 2020-12-24 DIAGNOSIS — R000 Tachycardia, unspecified: Secondary | ICD-10-CM | POA: Diagnosis not present

## 2020-12-31 ENCOUNTER — Emergency Department (HOSPITAL_COMMUNITY)
Admission: EM | Admit: 2020-12-31 | Discharge: 2021-01-01 | Disposition: A | Discharge status: Home / Self Care | Payer: 59 | Attending: Emergency Medicine | Admitting: Emergency Medicine

## 2020-12-31 ENCOUNTER — Encounter (HOSPITAL_COMMUNITY): Payer: Self-pay | Admitting: Emergency Medicine

## 2020-12-31 ENCOUNTER — Emergency Department (HOSPITAL_COMMUNITY): Payer: 59

## 2020-12-31 DIAGNOSIS — U071 COVID-19: Secondary | ICD-10-CM | POA: Diagnosis not present

## 2020-12-31 DIAGNOSIS — Z5321 Procedure and treatment not carried out due to patient leaving prior to being seen by health care provider: Secondary | ICD-10-CM | POA: Insufficient documentation

## 2020-12-31 DIAGNOSIS — R531 Weakness: Secondary | ICD-10-CM | POA: Diagnosis present

## 2020-12-31 LAB — COMPREHENSIVE METABOLIC PANEL
ALT: 18 U/L (ref 0–44)
AST: 24 U/L (ref 15–41)
Albumin: 3.6 g/dL (ref 3.5–5.0)
Alkaline Phosphatase: 125 U/L (ref 38–126)
Anion gap: 13 (ref 5–15)
BUN: 10 mg/dL (ref 6–20)
CO2: 19 mmol/L — ABNORMAL LOW (ref 22–32)
Calcium: 9.7 mg/dL (ref 8.9–10.3)
Chloride: 101 mmol/L (ref 98–111)
Creatinine, Ser: 0.56 mg/dL (ref 0.44–1.00)
GFR, Estimated: >60 mL/min (ref >60)
Glucose, Bld: 148 mg/dL — ABNORMAL HIGH (ref 70–99)
Potassium: 3.6 mmol/L (ref 3.5–5.1)
Sodium: 133 mmol/L — ABNORMAL LOW (ref 135–145)
Total Bilirubin: 0.4 mg/dL (ref 0.3–1.2)
Total Protein: 7.9 g/dL (ref 6.5–8.1)

## 2020-12-31 LAB — CBC WITH DIFFERENTIAL/PLATELET
Abs Immature Granulocytes: 0.75 10*3/uL — ABNORMAL HIGH (ref 0.00–0.07)
Basophils Absolute: 0.1 10*3/uL (ref 0.0–0.1)
Basophils Relative: 1 %
Eosinophils Absolute: 0.1 10*3/uL (ref 0.0–0.5)
Eosinophils Relative: 1 %
HCT: 42.7 % (ref 36.0–46.0)
Hemoglobin: 14.1 g/dL (ref 12.0–15.0)
Immature Granulocytes: 5 %
Lymphocytes Relative: 23 %
Lymphs Abs: 3.4 10*3/uL (ref 0.7–4.0)
MCH: 29.9 pg (ref 26.0–34.0)
MCHC: 33 g/dL (ref 30.0–36.0)
MCV: 90.7 fL (ref 80.0–100.0)
Monocytes Absolute: 1 10*3/uL (ref 0.1–1.0)
Monocytes Relative: 7 %
Neutro Abs: 9.5 10*3/uL — ABNORMAL HIGH (ref 1.7–7.7)
Neutrophils Relative %: 63 %
Platelets: 488 10*3/uL — ABNORMAL HIGH (ref 150–400)
RBC: 4.71 MIL/uL (ref 3.87–5.11)
RDW: 14.9 % (ref 11.5–15.5)
WBC: 14.9 10*3/uL — ABNORMAL HIGH (ref 4.0–10.5)
nRBC: 0 % (ref 0.0–0.2)

## 2020-12-31 LAB — LACTIC ACID, PLASMA: Lactic Acid, Venous: 1.2 mmol/L (ref 0.5–1.9)

## 2020-12-31 LAB — GLUCOSE, CAPILLARY: Glucose-Capillary: 122 mg/dL — ABNORMAL HIGH (ref 70–99)

## 2020-12-31 NOTE — ED Triage Notes (Signed)
Per EMS-states covid pos on 1/13-discharged from [facility] 1/15-states family wanted her to come to [facility] due to [facility] not helping her and needing longer admission

## 2020-12-31 NOTE — ED Notes (Signed)
Pt gave her stickers to the screeners and left the department.

## 2021-01-01 NOTE — ED Notes (Signed)
Pt called 3x for room placement. Eloped from waiting area.  

## 2021-01-25 ENCOUNTER — Telehealth: Payer: Self-pay

## 2021-01-25 ENCOUNTER — Telehealth: Payer: Self-pay | Admitting: Hematology and Oncology

## 2021-01-25 NOTE — Telephone Encounter (Signed)
Amy Hill called from [place] office (Dr. Delena Bali) in regards to patient in losts of pain. Wanted to know if any of our providers can see patient this afternoon. Notified Rosanne Sack PA-C.

## 2021-01-25 NOTE — Telephone Encounter (Signed)
Per Levada Dy, patient added 2/14 w/Melissa at 10:00 am

## 2021-01-28 ENCOUNTER — Other Ambulatory Visit: Payer: Self-pay

## 2021-01-28 ENCOUNTER — Other Ambulatory Visit: Payer: Self-pay | Admitting: Hematology and Oncology

## 2021-01-28 ENCOUNTER — Inpatient Hospital Stay: Payer: 59 | Attending: Oncology | Admitting: Hematology and Oncology

## 2021-01-28 ENCOUNTER — Inpatient Hospital Stay: Payer: 59

## 2021-01-28 VITALS — BP 91/62 | HR 143 | Temp 97.7°F | Resp 16 | Ht 61.5 in | Wt 111.5 lb

## 2021-01-28 VITALS — BP 120/69 | HR 111 | Temp 97.7°F | Resp 18

## 2021-01-28 DIAGNOSIS — E861 Hypovolemia: Secondary | ICD-10-CM

## 2021-01-28 DIAGNOSIS — C3411 Malignant neoplasm of upper lobe, right bronchus or lung: Secondary | ICD-10-CM | POA: Diagnosis present

## 2021-01-28 DIAGNOSIS — C3490 Malignant neoplasm of unspecified part of unspecified bronchus or lung: Secondary | ICD-10-CM

## 2021-01-28 DIAGNOSIS — I959 Hypotension, unspecified: Secondary | ICD-10-CM | POA: Diagnosis present

## 2021-01-28 DIAGNOSIS — R000 Tachycardia, unspecified: Secondary | ICD-10-CM | POA: Insufficient documentation

## 2021-01-28 MED ORDER — SODIUM CHLORIDE 0.9 % IV SOLN
Freq: Once | INTRAVENOUS | Status: AC
Start: 2021-01-28 — End: 2021-01-28
  Filled 2021-01-28: qty 250

## 2021-01-28 MED ORDER — HEPARIN SOD (PORK) LOCK FLUSH 100 UNIT/ML IV SOLN
500.0000 [IU] | Freq: Once | INTRAVENOUS | Status: AC | PRN
  Administered 2021-01-28: 500 [IU]
  Filled 2021-01-28: qty 5

## 2021-01-28 NOTE — Progress Notes (Signed)
[facility]  [address] [place],  [place]  [address] [phone]  Clinic Day:  12/06/2020  Referring physician: Renaldo Reel, PA   HISTORY OF PRESENT ILLNESS:  The patient is a 60 y.o. female with limited stage small-cell lung cancer. She comes into clinic today for treatment planning regarding new concerns for progression of disease. Angela Kane was most recently evaluated in the ED for lower extremity edema. Doppler ultrasound studies were performed and no DVT was found. CT imaging of the chest revealed an enlarging pleural base lesion overlying the posterolateral right lower lobe which has clearly increased from 11-16-2020 and directly abuts the posterior aspect of the tenth rib. Findings are worrisome for recurrent tumor. Today, she appears very ill and complains of weakness. Her daughter is in attendance for this visit. She states Angela Kane was scheduled for a PET scan this morning per pulmonology, however, this had been canceled due to insurance. They have been in discussion with their primary care physician and Angela Kane is not interested in pursuing further treatment for her lung cancer. She is requesting Hospice referral. This was put in place by her primary care team and she will be evaluated this afternoon by the Hospice intake nurse. She reports three new skin lesions noted to the scalp and right shoulder. She complains of generalized pain, especially to her left leg. She was started on oxycodone; however, the daughter states this has been very sedating. She states her mother has been sleeping more and is not as interactive as she has been in the past. Her daughter is concerned that her mother's disease has now spread to her brain. Today Angela Kane is oriented and answers questions appropriately including verbalizing that she does not want further treatment for her lung cancer. She is hypotensive in the clinic today with tachycardia.   PHYSICAL EXAM:  Blood  pressure 91/62, pulse (!) 143, temperature 97.7 F (36.5 C), temperature source Oral, resp. rate 16, height 5' 1.5" (1.562 m), weight 111 lb 8 oz (50.6 kg), SpO2 95 %. Wt Readings from Last 3 Encounters:  01/28/21 111 lb 8 oz (50.6 kg)  12/06/20 118 lb 11.2 oz (53.8 kg)  11/30/19 140 lb (63.5 kg)   Body mass index is 20.73 kg/m. Performance status (ECOG): 3 - Symptomatic, >50% confined to bed Physical Exam Constitutional:      General: She is not in acute distress.    Appearance: She is normal weight. She is ill-appearing. She is not toxic-appearing or diaphoretic.  HENT:     Head: Normocephalic and atraumatic.     Nose: No congestion or rhinorrhea.     Mouth/Throat:     Mouth: Mucous membranes are moist.     Pharynx: Oropharynx is clear. No oropharyngeal exudate or posterior oropharyngeal erythema.  Eyes:     General: No scleral icterus.       Right eye: No discharge.        Left eye: No discharge.     Extraocular Movements: Extraocular movements intact.     Conjunctiva/sclera: Conjunctivae normal.     Pupils: Pupils are equal, round, and reactive to light.  Neck:     Vascular: No carotid bruit.  Cardiovascular:     Rate and Rhythm: Regular rhythm. Tachycardia present.     Heart sounds: No murmur heard. No friction rub. No gallop.   Pulmonary:     Effort: Pulmonary effort is normal. No respiratory distress.     Breath sounds: Normal breath sounds. No  stridor. No wheezing, rhonchi or rales.  Chest:     Chest wall: No tenderness.  Abdominal:     General: Abdomen is flat. Bowel sounds are normal. There is no distension.     Palpations: There is no mass.     Tenderness: There is no abdominal tenderness. There is no right CVA tenderness, left CVA tenderness, guarding or rebound.     Hernia: No hernia is present.  Musculoskeletal:        General: No swelling, tenderness, deformity or signs of injury. Normal range of motion.     Cervical back: Normal range of motion and neck  supple. No rigidity or tenderness.     Right lower leg: Edema present.     Left lower leg: Edema present.  Lymphadenopathy:     Cervical: No cervical adenopathy.  Skin:    General: Skin is warm and dry.     Capillary Refill: Capillary refill takes 2 to 3 seconds.     Coloration: Skin is pale. Skin is not jaundiced.     Findings: No bruising, erythema, lesion or rash.  Neurological:     General: No focal deficit present.     Mental Status: She is alert and oriented to person, place, and time. Mental status is at baseline.     Cranial Nerves: No cranial nerve deficit.     Sensory: No sensory deficit.     Motor: No weakness.     Coordination: Coordination normal.     Gait: Gait normal.     Deep Tendon Reflexes: Reflexes normal.  Psychiatric:        Mood and Affect: Mood normal.        Behavior: Behavior normal.        Thought Content: Thought content normal.        Judgment: Judgment normal.     LABS:   CBC Latest Ref Rng & Units 12/31/2020 11/30/2019 11/22/2019  WBC 4.0 - 10.5 K/uL 14.9(H) 11.9(H) 15.3(H)  Hemoglobin 12.0 - 15.0 g/dL 14.1 14.5 14.1  Hematocrit 36.0 - 46.0 % 42.7 46.0 42.7  Platelets 150 - 400 K/uL 488(H) 456(H) 540.0(H)   CMP Latest Ref Rng & Units 12/31/2020 11/30/2019 11/22/2019  Glucose 70 - 99 mg/dL 148(H) 193(H) 214(H)  BUN 6 - 20 mg/dL 10 <5(L) 6  Creatinine 0.44 - 1.00 mg/dL 0.56 0.81 0.76  Sodium 135 - 145 mmol/L 133(L) 135 139  Potassium 3.5 - 5.1 mmol/L 3.6 3.6 3.8  Chloride 98 - 111 mmol/L 101 102 104  CO2 22 - 32 mmol/L 19(L) 22 27  Calcium 8.9 - 10.3 mg/dL 9.7 8.9 9.3  Total Protein 6.5 - 8.1 g/dL 7.9 7.2 6.8  Total Bilirubin 0.3 - 1.2 mg/dL 0.4 0.6 0.3  Alkaline Phos 38 - 126 U/L 125 88 107  AST 15 - 41 U/L 24 37 32  ALT 0 - 44 U/L 18 44 48(H)    STUDIES:   Exam(s): [id number] CT/CT CHEST W/ CM CLINICAL DATA:  Follow-up abnormal chest radiograph. History of small cell lung cancer.  EXAM: CT CHEST WITH  CONTRAST  TECHNIQUE: Multidetector CT imaging of the chest was performed during intravenous contrast administration.  CONTRAST:  60 cc Isovue 370  COMPARISON:  12/23/2020  FINDINGS: Cardiovascular: Heart size is within normal limits. No pericardial effusion identified. Aortic atherosclerosis. Coronary artery calcifications.  Mediastinum/Nodes: Nodule arising off the inferior pole of the right lobe of thyroid gland is again noted measuring 2.1 x 2.5 cm. Recommend thyroid US (ref:  J Am Coll Radiol. 2015 Feb;12(2): 143-50).  The trachea appears patent and is midline. Normal appearance of the esophagus.  No enlarged axillary or supraclavicular lymph nodes. No mediastinal or hilar adenopathy.  Lungs/Pleura: Centrilobular and paraseptal emphysema. No pleural effusion or airspace consolidation identified. Pleural base mass overlying the posterolateral right lower lobe is more conspicuous when compared with the exam from 12/23/2020. This directly abuts the posterior aspect of the right tenth rib measuring 3.2 x 1.1 cm, image 83/3. On 11/17/20 this measured 2.3 x 0.7 cm. Paramediastinal architectural distortion and fibrosis is noted within the right lung and is favored to represent changes due to external beam radiation.  Upper Abdomen: No acute abnormality within the imaged portions of the upper abdomen. Previous cholecystectomy. Changes due to pancreatitis are again identified a with mild diffuse low attenuation edema multiple surrounding pseudo cysts. The appearance is similar to 12/27/2020.  Musculoskeletal: No chest wall abnormality. No acute or significant osseous findings.  IMPRESSION: 1. There is an enlarging pleural base lesion overlying the posterolateral right lower lobe which is more conspicuous when compared with the exam from 12/23/2020 and has clearly increased in size from 11/16/2020. This directly abuts the posterior aspect of the right tenth rib. Findings are  worrisome for recurrent tumor. Consider further evaluation with PET-CT. 2. Coronary artery calcifications noted. 3. Changes due to pancreatitis. With similar appearing surrounding low attenuation fluid collections compatible with pseudocysts. 4. Aortic Atherosclerosis (ICD10-I70.0) and Emphysema (ICD10-J43.9).   Electronically Signed   By: Kerby Moors M.D.   On: 01/10/2021 13:57  Electronically Signed By: Angelita Ingles MD  Electronically Signed Date/Time: 01/27/221359 Dictate Date/Time: 01/10/21 1346  Technologist: Sheron Nightingale Transcribed By: Odie Sera Transcribed Date/Time: 01/10/21 1357    ASSESSMENT & PLAN:  Assessment/Plan:  A 60 y.o. female with limited stage small cell lung cancer.  CT imaging is concerning for progression of disease and a PET scan is recommended. This has not been authorized through insurance yet and the patient states she now desires Hospice intervention. We discussed that the PET scan could be canceled for now if the patient does not intend on pursuing treatment. Hospice has already been arranged through her PCP and she is to be evaluated this afternoon by the intake nurse. We will send her over for a liter of normal saline today as she is hypotensive and tachycardic. Hopefully, this will make her feel somewhat better. Both the patient and her daughter are in agreement to allow management through Hospice. I have informed the patient and her daughter that we are available for any concerns or needs.   Both the patient and family verbalize understanding of and agreement to the plans discussed today. They know to call the office should any new questions or concerns arise.   Melodye Ped, NP

## 2021-01-28 NOTE — Patient Instructions (Signed)
Dehydration, Adult Dehydration is a condition in which there is not enough water or other fluids in the body. This happens when a person loses more fluids than he or she takes in. Important organs, such as the kidneys, brain, and heart, cannot function without a proper amount of fluids. Any loss of fluids from the body can lead to dehydration. Dehydration can be mild, moderate, or severe. It should be treated right away to prevent it from becoming severe. What are the causes? Dehydration may be caused by:  Conditions that cause loss of water or other fluids, such as diarrhea, vomiting, or sweating or urinating a lot.  Not drinking enough fluids, especially when you are ill or doing activities that require a lot of energy.  Other illnesses and conditions, such as fever or infection.  Certain medicines, such as medicines that remove excess fluid from the body (diuretics).  Lack of safe drinking water.  Not being able to get enough water and food. What increases the risk? The following factors may make you more likely to develop this condition:  Having a long-term (chronic) illness that has not been treated properly, such as diabetes, heart disease, or kidney disease.  Being 65 years of age or older.  Having a disability.  Living in a place that is high in altitude, where thinner, drier air causes more fluid loss.  Doing exercises that put stress on your body for a long time (endurance sports). What are the signs or symptoms? Symptoms of dehydration depend on how severe it is. Mild or moderate dehydration  Thirst.  Dry lips or dry mouth.  Dizziness or light-headedness, especially when standing up from a seated position.  Muscle cramps.  Dark urine. Urine may be the color of tea.  Less urine or tears produced than usual.  Headache. Severe dehydration  Changes in skin. Your skin may be cold and clammy, blotchy, or pale. Your skin also may not return to normal after being  lightly pinched and released.  Little or no tears, urine, or sweat.  Changes in vital signs, such as rapid breathing and low blood pressure. Your pulse may be weak or may be faster than 100 beats a minute when you are sitting still.  Other changes, such as: ? Feeling very thirsty. ? Sunken eyes. ? Cold hands and feet. ? Confusion. ? Being very tired (lethargic) or having trouble waking from sleep. ? Short-term weight loss. ? Loss of consciousness. How is this diagnosed? This condition is diagnosed based on your symptoms and a physical exam. You may have blood and urine tests to help confirm the diagnosis. How is this treated? Treatment for this condition depends on how severe it is. Treatment should be started right away. Do not wait until dehydration becomes severe. Severe dehydration is an emergency and needs to be treated in a hospital.  Mild or moderate dehydration can be treated at home. You may be asked to: ? Drink more fluids. ? Drink an oral rehydration solution (ORS). This drink helps restore proper amounts of fluids and salts and minerals in the blood (electrolytes).  Severe dehydration can be treated: ? With IV fluids. ? By correcting abnormal levels of electrolytes. This is often done by giving electrolytes through a tube that is passed through your nose and into your stomach (nasogastric tube, or NG tube). ? By treating the underlying cause of dehydration. Follow these instructions at home: Oral rehydration solution If told by your health care provider, drink an ORS:  Make   an ORS by following instructions on the package.  Start by drinking small amounts, about  cup (120 mL) every 5-10 minutes.  Slowly increase how much you drink until you have taken the amount recommended by your health care provider. Eating and drinking  Drink enough clear fluid to keep your urine pale yellow. If you were told to drink an ORS, finish the ORS first and then start slowly drinking  other clear fluids. Drink fluids such as: ? Water. Do not drink only water. Doing that can lead to hyponatremia, which is having too little salt (sodium) in the body. ? Water from ice chips you suck on. ? Fruit juice that you have added water to (diluted fruit juice). ? Low-calorie sports drinks.  Eat foods that contain a healthy balance of electrolytes, such as bananas, oranges, potatoes, tomatoes, and spinach.  Do not drink alcohol.  Avoid the following: ? Drinks that contain a lot of sugar. These include high-calorie sports drinks, fruit juice that is not diluted, and soda. ? Caffeine. ? Foods that are greasy or contain a lot of fat or sugar.         General instructions  Take over-the-counter and prescription medicines only as told by your health care provider.  Do not take sodium tablets. Doing that can lead to having too much sodium in the body (hypernatremia).  Return to your normal activities as told by your health care provider. Ask your health care provider what activities are safe for you.  Keep all follow-up visits as told by your health care provider. This is important. Contact a health care provider if:  You have muscle cramps, pain, or discomfort, such as: ? Pain in your abdomen and the pain gets worse or stays in one area (localizes). ? Stiff neck.  You have a rash.  You are more irritable than usual.  You are sleepier or have a harder time waking than usual.  You feel weak or dizzy.  You feel very thirsty. Get help right away if you have:  Any symptoms of severe dehydration.  Symptoms of vomiting, such as: ? You cannot eat or drink without vomiting. ? Vomiting gets worse or does not go away. ? Vomit includes blood or green matter (bile).  Symptoms that get worse with treatment.  A fever.  A severe headache.  Problems with urination or bowel movements, such as: ? Diarrhea that gets worse or does not go away. ? Blood in your stool (feces).  This may cause stool to look black and tarry. ? Not urinating, or urinating only a small amount of very dark urine, within 6-8 hours.  Trouble breathing. These symptoms may represent a serious problem that is an emergency. Do not wait to see if the symptoms will go away. Get medical help right away. Call your local emergency services (911 in the [place] Do not drive yourself to the hospital. Summary  Dehydration is a condition in which there is not enough water or other fluids in the body. This happens when a person loses more fluids than he or she takes in.  Treatment for this condition depends on how severe it is. Treatment should be started right away. Do not wait until dehydration becomes severe.  Drink enough clear fluid to keep your urine pale yellow. If you were told to drink an oral rehydration solution (ORS), finish the ORS first and then start slowly drinking other clear fluids.  Take over-the-counter and prescription medicines only as told by your health   care provider.  Get help right away if you have any symptoms of severe dehydration. This information is not intended to replace advice given to you by your health care provider. Make sure you discuss any questions you have with your health care provider. Document Revised: 07/14/2019 Document Reviewed: 07/14/2019 [employer] Patient Education  2021 [employer]  

## 2021-01-28 NOTE — Progress Notes (Signed)
1250: Patient received from [place] [address] to have 1 liter IVF over 1 hr then d/c to Hospice. She is extremely lethargic but arouses to  tactile stimulus. Tachycardia present hypotensive. She denies pain and discomfort at present.

## 2021-03-15 DEATH — deceased

## 2021-04-05 ENCOUNTER — Ambulatory Visit: Payer: Medicare HMO | Admitting: Oncology

## 2021-10-04 IMAGING — CR DG CHEST 2V
2 series · 2 of 2 positions shown · non-contrast
Comparison: December 27, 2020

CLINICAL DATA: Cough.  Recent [id number] positive

EXAM:
CHEST - 2 VIEW

[w chest lat]
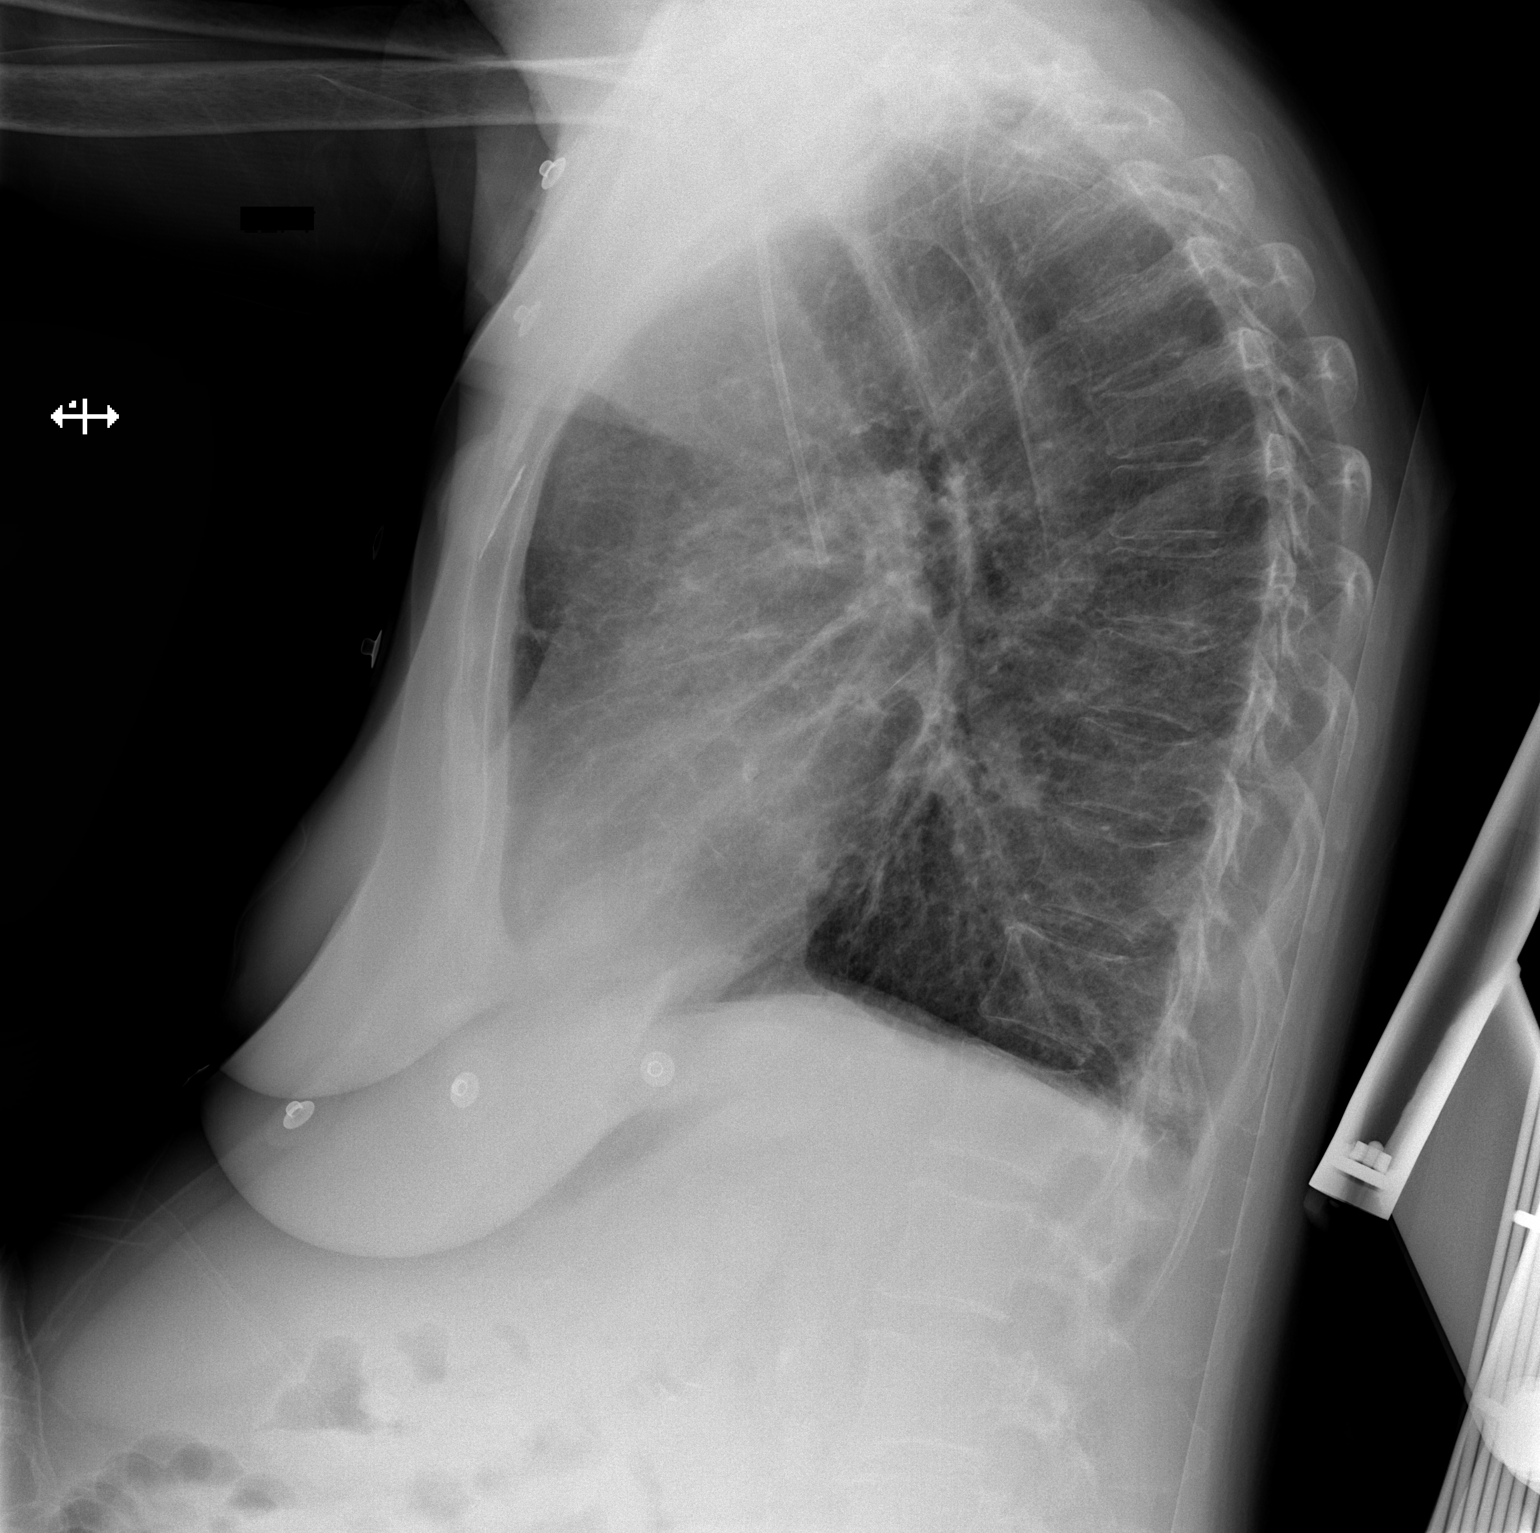

[x chest ap]
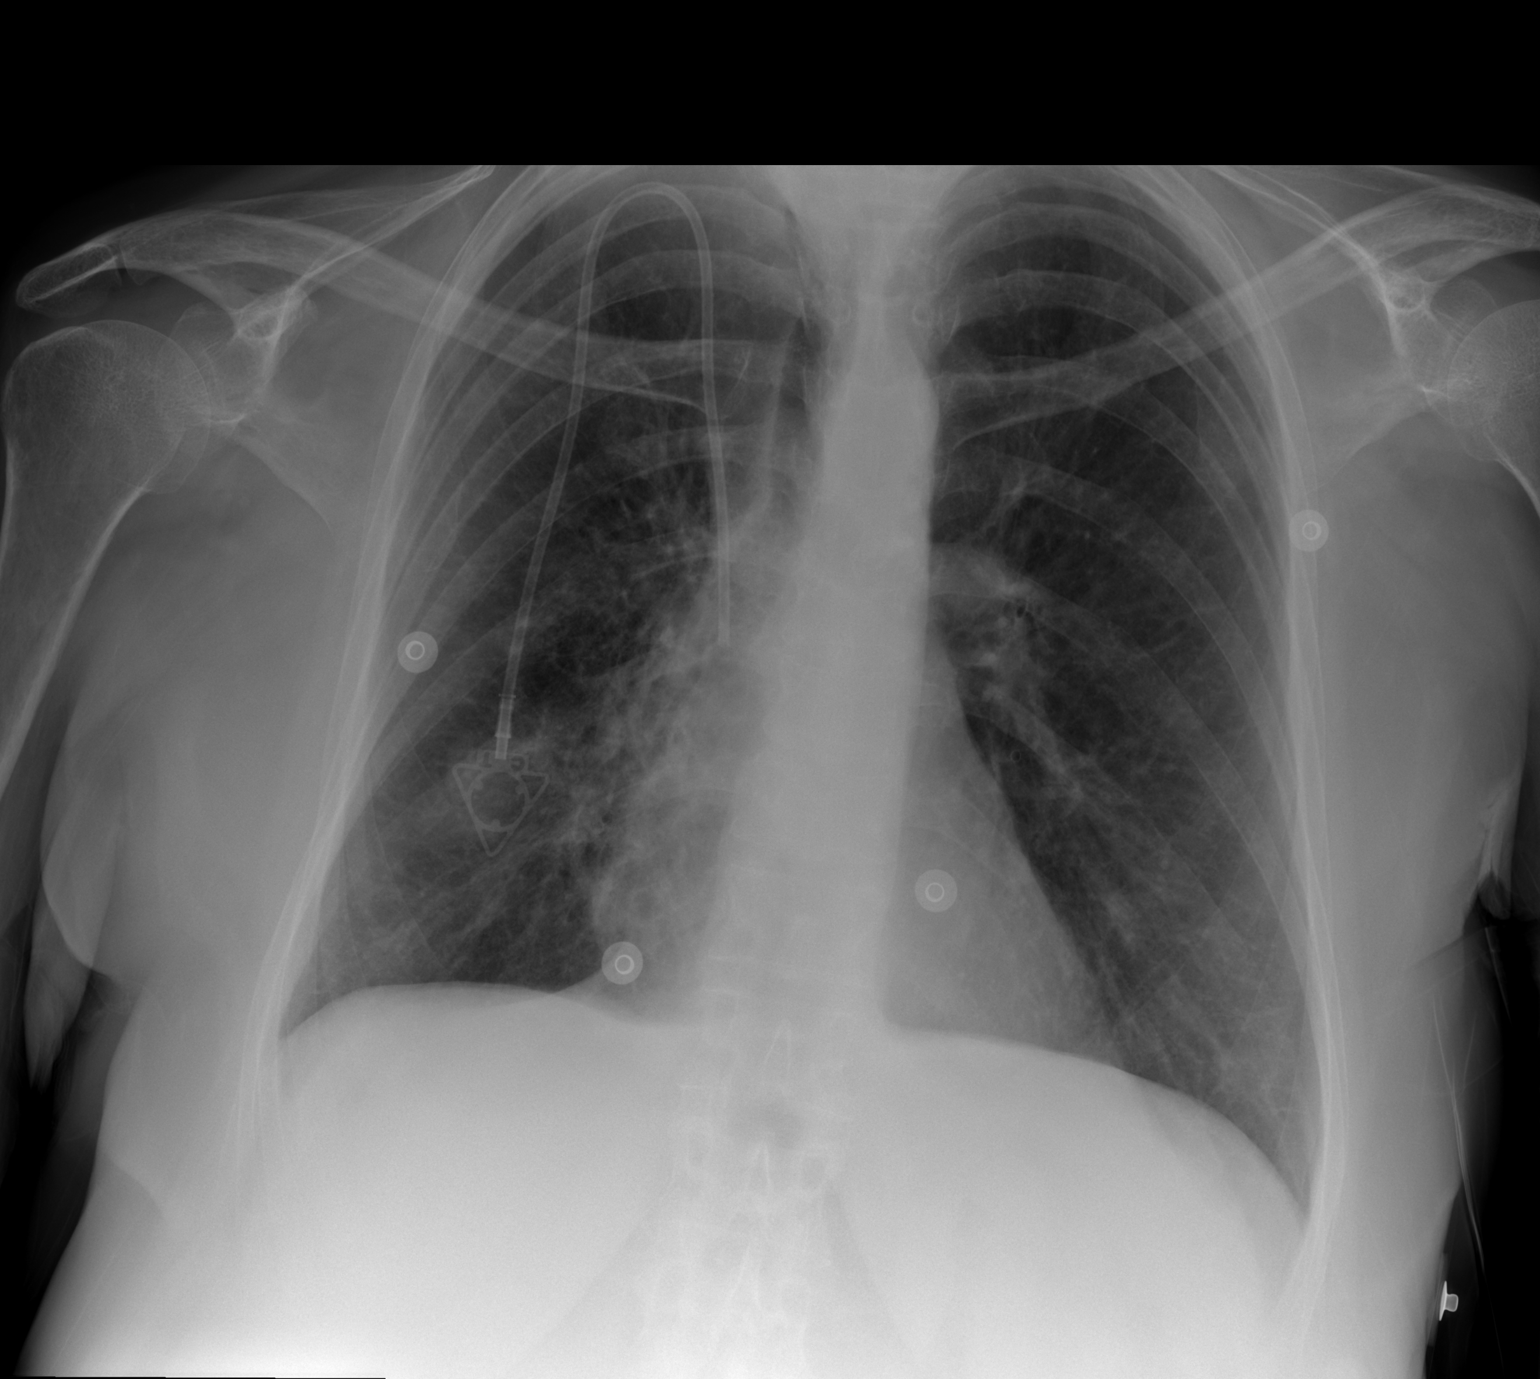

[2 of 2 positions shown; findings below may reference images not displayed]

FINDINGS: Port-A-Cath tip is in the superior vena cava. No pneumothorax. There
is slight perihilar and right infrahilar interstitial thickening on
the right without consolidation. Lungs elsewhere clear. Heart size
and pulmonary vascularity are normal. No adenopathy. No bone
lesions.
IMPRESSION: Subtle right perihilar and right infrahilar opacity which may
represent residua of atypical organism pneumonia. Lungs elsewhere
clear. Heart size normal. Port-A-Cath tip in superior vena cava
without pneumothorax.
